# Patient Record
Sex: Male | Born: 1970 | Hispanic: Refuse to answer | Marital: Married | Smoking: Never smoker
Health system: Southern US, Community
[De-identification: ages and names within clinical notes are randomized; demographics above are authoritative.]

## PROBLEM LIST (undated history)

## (undated) DIAGNOSIS — R51 Headache: Secondary | ICD-10-CM

## (undated) DIAGNOSIS — I2510 Atherosclerotic heart disease of native coronary artery without angina pectoris: Secondary | ICD-10-CM

## (undated) DIAGNOSIS — L309 Dermatitis, unspecified: Secondary | ICD-10-CM

## (undated) DIAGNOSIS — R7611 Nonspecific reaction to tuberculin skin test without active tuberculosis: Secondary | ICD-10-CM

## (undated) DIAGNOSIS — K219 Gastro-esophageal reflux disease without esophagitis: Secondary | ICD-10-CM

## (undated) DIAGNOSIS — I10 Essential (primary) hypertension: Secondary | ICD-10-CM

## (undated) DIAGNOSIS — F909 Attention-deficit hyperactivity disorder, unspecified type: Secondary | ICD-10-CM

## (undated) HISTORY — DX: Atherosclerotic heart disease of native coronary artery without angina pectoris: I25.10

## (undated) HISTORY — DX: Essential (primary) hypertension: I10

## (undated) HISTORY — DX: Dermatitis, unspecified: L30.9

## (undated) HISTORY — DX: Nonspecific reaction to tuberculin skin test without active tuberculosis: R76.11

## (undated) HISTORY — PX: OTHER SURGICAL HISTORY: SHX169

## (undated) HISTORY — DX: Headache: R51

## (undated) HISTORY — DX: Gastro-esophageal reflux disease without esophagitis: K21.9

## (undated) HISTORY — DX: Attention-deficit hyperactivity disorder, unspecified type: F90.9

---

## 1999-07-02 HISTORY — PX: UPPER GI ENDOSCOPY: SHX6162

## 2007-10-20 ENCOUNTER — Ambulatory Visit: Payer: Self-pay | Admitting: Family Medicine

## 2007-10-20 DIAGNOSIS — K219 Gastro-esophageal reflux disease without esophagitis: Secondary | ICD-10-CM

## 2007-10-20 DIAGNOSIS — J309 Allergic rhinitis, unspecified: Secondary | ICD-10-CM | POA: Insufficient documentation

## 2007-10-20 DIAGNOSIS — R51 Headache: Secondary | ICD-10-CM

## 2007-10-20 DIAGNOSIS — I10 Essential (primary) hypertension: Secondary | ICD-10-CM | POA: Insufficient documentation

## 2007-10-20 DIAGNOSIS — J45909 Unspecified asthma, uncomplicated: Secondary | ICD-10-CM | POA: Insufficient documentation

## 2007-10-20 DIAGNOSIS — F909 Attention-deficit hyperactivity disorder, unspecified type: Secondary | ICD-10-CM | POA: Insufficient documentation

## 2007-10-20 DIAGNOSIS — R519 Headache, unspecified: Secondary | ICD-10-CM | POA: Insufficient documentation

## 2007-10-20 DIAGNOSIS — J301 Allergic rhinitis due to pollen: Secondary | ICD-10-CM | POA: Insufficient documentation

## 2007-10-22 ENCOUNTER — Ambulatory Visit: Payer: Self-pay | Admitting: Family Medicine

## 2007-10-22 LAB — CONVERTED CEMR LAB
Blood in Urine, dipstick: NEGATIVE
Ketones, urine, test strip: NEGATIVE
Nitrite: NEGATIVE
Protein, U semiquant: NEGATIVE
Urobilinogen, UA: 0.2

## 2007-10-23 LAB — CONVERTED CEMR LAB
Alkaline Phosphatase: 72 units/L (ref 39–117)
Basophils Absolute: 0 10*3/uL (ref 0.0–0.1)
Bilirubin, Direct: 0.1 mg/dL (ref 0.0–0.3)
CO2: 32 meq/L (ref 19–32)
GFR calc Af Amer: 109 mL/min
Glucose, Bld: 86 mg/dL (ref 70–99)
HDL: 34.8 mg/dL — ABNORMAL LOW (ref >39.0)
LDL Cholesterol: 114 mg/dL — ABNORMAL HIGH (ref 0–99)
Lymphocytes Relative: 30.8 % (ref 12.0–46.0)
Monocytes Absolute: 0.5 10*3/uL (ref 0.1–1.0)
Monocytes Relative: 8.7 % (ref 3.0–12.0)
Platelets: 173 10*3/uL (ref 150–400)
Potassium: 4.3 meq/L (ref 3.5–5.1)
RDW: 12.1 % (ref 11.5–14.6)
Sodium: 142 meq/L (ref 135–145)
Total Bilirubin: 1.1 mg/dL (ref 0.3–1.2)
Total CHOL/HDL Ratio: 5.2
Total Protein: 6.8 g/dL (ref 6.0–8.3)
Triglycerides: 165 mg/dL — ABNORMAL HIGH (ref 0–149)
VLDL: 33 mg/dL (ref 0–40)

## 2007-10-27 ENCOUNTER — Encounter: Payer: Self-pay | Admitting: Family Medicine

## 2008-01-05 ENCOUNTER — Ambulatory Visit: Payer: Self-pay | Admitting: Family Medicine

## 2008-01-05 DIAGNOSIS — M545 Low back pain: Secondary | ICD-10-CM

## 2008-04-06 ENCOUNTER — Encounter: Payer: Self-pay | Admitting: Family Medicine

## 2008-12-07 ENCOUNTER — Ambulatory Visit: Payer: Self-pay | Admitting: Family Medicine

## 2008-12-07 LAB — CONVERTED CEMR LAB
Bilirubin Urine: NEGATIVE
Blood in Urine, dipstick: NEGATIVE
Protein, U semiquant: NEGATIVE
Urobilinogen, UA: 0.2
pH: 5

## 2008-12-09 LAB — CONVERTED CEMR LAB
ALT: 51 units/L (ref 0–53)
AST: 32 units/L (ref 0–37)
BUN: 14 mg/dL (ref 6–23)
Basophils Absolute: 0 10*3/uL (ref 0.0–0.1)
Bilirubin, Direct: 0.1 mg/dL (ref 0.0–0.3)
Calcium: 9.2 mg/dL (ref 8.4–10.5)
Cholesterol: 199 mg/dL (ref 0–200)
Creatinine, Ser: 1 mg/dL (ref 0.4–1.5)
Eosinophils Relative: 4.4 % (ref 0.0–5.0)
GFR calc non Af Amer: 89.71 mL/min (ref >60)
Glucose, Bld: 85 mg/dL (ref 70–99)
HDL: 45.1 mg/dL (ref >39.00)
LDL Cholesterol: 123 mg/dL — ABNORMAL HIGH (ref 0–99)
Lymphocytes Relative: 26.2 % (ref 12.0–46.0)
Lymphs Abs: 1.6 10*3/uL (ref 0.7–4.0)
Monocytes Relative: 8.4 % (ref 3.0–12.0)
Neutrophils Relative %: 60.6 % (ref 43.0–77.0)
Platelets: 154 10*3/uL (ref 150.0–400.0)
Potassium: 4.2 meq/L (ref 3.5–5.1)
RDW: 11.9 % (ref 11.5–14.6)
TSH: 1.4 microintl units/mL (ref 0.35–5.50)
Total Bilirubin: 1 mg/dL (ref 0.3–1.2)
Triglycerides: 154 mg/dL — ABNORMAL HIGH (ref 0.0–149.0)
VLDL: 30.8 mg/dL (ref 0.0–40.0)
WBC: 6 10*3/uL (ref 4.5–10.5)

## 2009-02-15 ENCOUNTER — Ambulatory Visit: Payer: Self-pay | Admitting: Family Medicine

## 2009-02-15 DIAGNOSIS — L723 Sebaceous cyst: Secondary | ICD-10-CM

## 2009-02-15 DIAGNOSIS — R002 Palpitations: Secondary | ICD-10-CM

## 2009-02-15 DIAGNOSIS — L989 Disorder of the skin and subcutaneous tissue, unspecified: Secondary | ICD-10-CM | POA: Insufficient documentation

## 2009-03-14 ENCOUNTER — Encounter: Payer: Self-pay | Admitting: Family Medicine

## 2009-03-27 ENCOUNTER — Ambulatory Visit: Payer: Self-pay | Admitting: Family Medicine

## 2009-04-26 ENCOUNTER — Encounter: Payer: Self-pay | Admitting: Family Medicine

## 2009-05-05 ENCOUNTER — Telehealth: Payer: Self-pay | Admitting: Family Medicine

## 2009-06-12 ENCOUNTER — Encounter: Payer: Self-pay | Admitting: Family Medicine

## 2010-02-12 ENCOUNTER — Ambulatory Visit: Payer: Self-pay | Admitting: Family Medicine

## 2010-02-12 LAB — CONVERTED CEMR LAB
Blood in Urine, dipstick: NEGATIVE
Ketones, urine, test strip: NEGATIVE
Nitrite: NEGATIVE
Protein, U semiquant: NEGATIVE
Specific Gravity, Urine: 1.02
WBC Urine, dipstick: NEGATIVE
pH: 7

## 2010-02-13 LAB — CONVERTED CEMR LAB
AST: 23 units/L (ref 0–37)
Alkaline Phosphatase: 78 units/L (ref 39–117)
BUN: 17 mg/dL (ref 6–23)
Basophils Absolute: 0 10*3/uL (ref 0.0–0.1)
Bilirubin, Direct: 0.1 mg/dL (ref 0.0–0.3)
Calcium: 9.4 mg/dL (ref 8.4–10.5)
Eosinophils Relative: 3.7 % (ref 0.0–5.0)
GFR calc non Af Amer: 96.92 mL/min (ref >60)
Glucose, Bld: 89 mg/dL (ref 70–99)
HDL: 44.9 mg/dL (ref >39.00)
Lymphocytes Relative: 31.1 % (ref 12.0–46.0)
Monocytes Relative: 8.4 % (ref 3.0–12.0)
Neutrophils Relative %: 56.4 % (ref 43.0–77.0)
Platelets: 176 10*3/uL (ref 150.0–400.0)
RDW: 13.2 % (ref 11.5–14.6)
TSH: 1.23 microintl units/mL (ref 0.35–5.50)
Total Bilirubin: 0.8 mg/dL (ref 0.3–1.2)
Triglycerides: 118 mg/dL (ref 0.0–149.0)
VLDL: 23.6 mg/dL (ref 0.0–40.0)
WBC: 6 10*3/uL (ref 4.5–10.5)

## 2010-02-19 ENCOUNTER — Ambulatory Visit: Payer: Self-pay | Admitting: Family Medicine

## 2010-03-22 ENCOUNTER — Encounter: Payer: Self-pay | Admitting: Family Medicine

## 2010-05-28 ENCOUNTER — Telehealth: Payer: Self-pay | Admitting: Family Medicine

## 2010-07-03 ENCOUNTER — Telehealth: Payer: Self-pay | Admitting: *Deleted

## 2010-07-10 ENCOUNTER — Ambulatory Visit
Admission: RE | Admit: 2010-07-10 | Discharge: 2010-07-10 | Payer: Self-pay | Source: Home / Self Care | Attending: Family Medicine | Admitting: Family Medicine

## 2010-07-13 ENCOUNTER — Telehealth: Payer: Self-pay | Admitting: Family Medicine

## 2010-07-31 NOTE — Progress Notes (Signed)
Summary: ?about increasing bp med   Phone Note Call from Patient   Caller: vm  Summary of Call: call about increasing his med Benicar 20mg  to 40mg   Initial call taken by: Pura Spice, RN,  May 28, 2010 5:44 PM  Follow-up for Phone Call        called pt left mess to return call   per med list benicar not on his med list.  Follow-up by: Pura Spice, RN,  May 28, 2010 5:44 PM  Additional Follow-up for Phone Call Additional follow up Details #1::        another mess left to return call  Additional Follow-up by: Pura Spice, RN,  May 30, 2010 4:24 PM    Additional Follow-up for Phone Call Additional follow up Details #2::    pt never returned call  Follow-up by: Pura Spice, RN,  June 01, 2010 9:23 AM

## 2010-07-31 NOTE — Assessment & Plan Note (Signed)
Summary: CPX // RS   Vital Signs:  Patient profile:   40 year old male Weight:      217 pounds BMI:     30.37 BP sitting:   110 / 84  (left arm) Cuff size:   regular  Vitals Entered By: Raechel Ache, RN (February 19, 2010 9:10 AM) CC: CPX, labs done.   History of Present Illness: 40 yr old male for a cpx. He feels fine and has no concerns.  Allergies (verified): No Known Drug Allergies  Past History:  Past Medical History: Allergic rhinitis Asthma Headache Hypertension GERD Positive PPD, treated ADHD  Past Surgical History: Reviewed history from 10/20/2007 and no changes required. Denies surgical history  Family History: Reviewed history from 10/20/2007 and no changes required. Family History of CAD Male 1st degree relative <50 Family History Diabetes 1st degree relative Family History High cholesterol Family History Hypertension Family History Lung cancer Family History of Stroke M 1st degree relative <50  Social History: Reviewed history from 10/20/2007 and no changes required.  Alcohol use-yes Marital Status: Married Children:  Occupation: Statistician  Never Smoked  Review of Systems  The patient denies anorexia, fever, weight loss, weight gain, vision loss, decreased hearing, hoarseness, chest pain, syncope, dyspnea on exertion, peripheral edema, prolonged cough, headaches, hemoptysis, abdominal pain, melena, hematochezia, severe indigestion/heartburn, hematuria, incontinence, genital sores, muscle weakness, suspicious skin lesions, transient blindness, difficulty walking, depression, unusual weight change, abnormal bleeding, enlarged lymph nodes, angioedema, breast masses, and testicular masses.    Physical Exam  General:  Well-developed,well-nourished,in no acute distress; alert,appropriate and cooperative throughout examination Head:  Normocephalic and atraumatic without obvious abnormalities. No apparent alopecia or balding. Eyes:   No corneal or conjunctival inflammation noted. EOMI. Perrla. Funduscopic exam benign, without hemorrhages, exudates or papilledema. Vision grossly normal. Ears:  External ear exam shows no significant lesions or deformities.  Otoscopic examination reveals clear canals, tympanic membranes are intact bilaterally without bulging, retraction, inflammation or discharge. Hearing is grossly normal bilaterally. Nose:  External nasal examination shows no deformity or inflammation. Nasal mucosa are pink and moist without lesions or exudates. Mouth:  Oral mucosa and oropharynx without lesions or exudates.  Teeth in good repair. Neck:  No deformities, masses, or tenderness noted. Chest Wall:  No deformities, masses, tenderness or gynecomastia noted. Lungs:  Normal respiratory effort, chest expands symmetrically. Lungs are clear to auscultation, no crackles or wheezes. Heart:  Normal rate and regular rhythm. S1 and S2 normal without gallop, murmur, click, rub or other extra sounds. Abdomen:  Bowel sounds positive,abdomen soft and non-tender without masses, organomegaly or hernias noted. Genitalia:  Testes bilaterally descended without nodularity, tenderness or masses. No scrotal masses or lesions. No penis lesions or urethral discharge. Msk:  No deformity or scoliosis noted of thoracic or lumbar spine.   Pulses:  R and L carotid,radial,femoral,dorsalis pedis and posterior tibial pulses are full and equal bilaterally Extremities:  No clubbing, cyanosis, edema, or deformity noted with normal full range of motion of all joints.   Neurologic:  No cranial nerve deficits noted. Station and gait are normal. Plantar reflexes are down-going bilaterally. DTRs are symmetrical throughout. Sensory, motor and coordinative functions appear intact. Skin:  Intact without suspicious lesions or rashes Cervical Nodes:  No lymphadenopathy noted Axillary Nodes:  No palpable lymphadenopathy Inguinal Nodes:  No significant  adenopathy Psych:  Cognition and judgment appear intact. Alert and cooperative with normal attention span and concentration. No apparent delusions, illusions, hallucinations   Impression & Recommendations:  Problem # 1:  WELL ADULT EXAM (ICD-V70.0)  Complete Medication List: 1)  Veramyst 27.5 Mcg/spray Susp (Fluticasone furoate) .... 2 sprays each nostril once daily 2)  Ventolin Hfa 108 (90 Base) Mcg/act Aers (Albuterol sulfate) .... 2 puffs every 4 hours as needed sob 3)  Prilosec Otc 20 Mg Tbec (Omeprazole magnesium) .... One by mouth daily 4)  Zyrtec Allergy 10 Mg Tabs (Cetirizine hcl) .Marland Kitchen.. 1 once daily prn 5)  Losartan Potassium 50 Mg Tabs (Losartan potassium) .... Once daily  Patient Instructions: 1)  Watch the diet. Switch to Losartan. 2)  Please schedule a follow-up appointment in 1 year.  Prescriptions: LOSARTAN POTASSIUM 50 MG TABS (LOSARTAN POTASSIUM) once daily  #30 x 11   Entered and Authorized by:   Nelwyn Salisbury MD   Signed by:   Nelwyn Salisbury MD on 02/19/2010   Method used:   Electronically to        [facility] [address] (retail)       [address]       [place], [place]  [address]       Ph: [phone]       Fax: [phone]   RxID:   [id number]

## 2010-07-31 NOTE — Letter (Signed)
Summary: Proof of Physical Letter  Proof of Physical Letter   Imported By: Maryln Gottron 03/27/2010 14:01:44  _____________________________________________________________________  External Attachment:    Type:   Image     Comment:   External Document

## 2010-08-02 NOTE — Progress Notes (Signed)
Summary: refill xyzal   Phone Note From Pharmacy   Caller: [facility] # [id number]* Call For: Gershon Crane MD  Summary of Call: refill xyzal 5mg  #30. Never filled now they have generic  would like for 1 yr  call pt [phone] Initial call taken by: Pura Spice, RN,  July 04, 2010 11:18 AM  Follow-up for Phone Call        call in a one year supply Follow-up by: Nelwyn Salisbury MD,  July 05, 2010 1:26 PM  Additional Follow-up for Phone Call Additional follow up Details #1::        Rx sent to pharmacy. Additional Follow-up by: Romualdo Bolk, CMA (AAMA),  July 05, 2010 1:47 PM    New/Updated Medications: XYZAL 5 MG TABS (LEVOCETIRIZINE DIHYDROCHLORIDE) 1 by mouth once daily Prescriptions: XYZAL 5 MG TABS (LEVOCETIRIZINE DIHYDROCHLORIDE) 1 by mouth once daily  #30 x 11   Entered by:   Romualdo Bolk, CMA (AAMA)   Authorized by:   Nelwyn Salisbury MD   Signed by:   Romualdo Bolk, CMA (AAMA) on 07/05/2010   Method used:   Electronically to        [id number] (retail)       [address]       [place], [place]  [address]       Ph: [phone]       Fax: [phone]   RxID:   [id number]

## 2010-08-02 NOTE — Assessment & Plan Note (Signed)
Summary: BP ELEVATED/NJR   Vital Signs:  Patient profile:   40 year old male Weight:      223 pounds O2 Sat:      98 % Temp:     98.3 degrees F Pulse rate:   68 / minute BP sitting:   118 / 80  (left arm) Cuff size:   large  Vitals Entered By: Pura Spice, RN (July 10, 2010 9:53 AM) CC: used to be on benicar  states bp went up and went to [employer] and they gave him a benicar . Pt does not like losartan    History of Present Illness: Here to discuss his BP. he had been taking Benicar with excellent control of his HTN and he felt great. Due to insurance reasons, we tried to switch him to Losartan, but this has not worked out well. He has tried this twice, and each time he felt bad with HAs and fatigue and his BP went up. Then twice he went back to Benicar and did well again.   Allergies (verified): No Known Drug Allergies  Past History:  Past Medical History: Reviewed history from 02/19/2010 and no changes required. Allergic rhinitis Asthma Headache Hypertension GERD Positive PPD, treated ADHD  Review of Systems  The patient denies anorexia, fever, weight loss, weight gain, vision loss, decreased hearing, hoarseness, chest pain, syncope, dyspnea on exertion, peripheral edema, prolonged cough, headaches, hemoptysis, abdominal pain, melena, hematochezia, severe indigestion/heartburn, hematuria, incontinence, genital sores, muscle weakness, suspicious skin lesions, transient blindness, difficulty walking, depression, unusual weight change, abnormal bleeding, enlarged lymph nodes, angioedema, breast masses, and testicular masses.    Physical Exam  General:  Well-developed,well-nourished,in no acute distress; alert,appropriate and cooperative throughout examination Neck:  No deformities, masses, or tenderness noted. Lungs:  Normal respiratory effort, chest expands symmetrically. Lungs are clear to auscultation, no crackles or wheezes. Heart:  Normal rate and regular  rhythm. S1 and S2 normal without gallop, murmur, click, rub or other extra sounds.   Impression & Recommendations:  Problem # 1:  HYPERTENSION (ICD-401.9)  The following medications were removed from the medication list:    Losartan Potassium 50 Mg Tabs (Losartan potassium) ..... Once daily His updated medication list for this problem includes:    Benicar 20 Mg Tabs (Olmesartan medoxomil) ..... Once daily  Complete Medication List: 1)  Veramyst 27.5 Mcg/spray Susp (Fluticasone furoate) .... 2 sprays each nostril once daily 2)  Ventolin Hfa 108 (90 Base) Mcg/act Aers (Albuterol sulfate) .... 2 puffs every 4 hours as needed sob 3)  Prilosec Otc 20 Mg Tbec (Omeprazole magnesium) .... One by mouth daily 4)  Xyzal 5 Mg Tabs (Levocetirizine dihydrochloride) .Marland Kitchen.. 1 by mouth once daily 5)  Benicar 20 Mg Tabs (Olmesartan medoxomil) .... Once daily  Patient Instructions: 1)  We will write for Benicar again, and I gave him samples. Will do a PA for this if needed.  Prescriptions: BENICAR 20 MG TABS (OLMESARTAN MEDOXOMIL) once daily  #30 x 11   Entered and Authorized by:   Nelwyn Salisbury MD   Signed by:   Nelwyn Salisbury MD on 07/10/2010   Method used:   Electronically to        [facility] [address] (retail)       [address]       [place], [place]  [address]       Ph: [phone]       Fax: [phone]   RxID:   [id number]  Orders Added: 1)  Est. Patient Level IV [id number]

## 2010-08-02 NOTE — Progress Notes (Signed)
Summary: rx veramyst   Phone Note Call from Patient   Caller: Patient Summary of Call: requestring [facility]yst [facility]  Initial call taken by: Pura Spice, RN,  July 13, 2010 4:37 PM  Follow-up for Phone Call        done  pt notified  Follow-up by: Pura Spice, RN,  July 13, 2010 4:38 PM    New/Updated Medications: VERAMYST 27.5 MCG/SPRAY  SUSP (FLUTICASONE FUROATE) 2 sprays each nostril once daily Prescriptions: VERAMYST 27.5 MCG/SPRAY  SUSP (FLUTICASONE FUROATE) 2 sprays each nostril once daily  #1 x 6   Entered by:   Pura Spice, RN   Authorized by:   Nelwyn Salisbury MD   Signed by:   Pura Spice, RN on 07/13/2010   Method used:   Electronically to        [id number] (retail)       [address]       [place], [place]  [address]       Ph: [phone]       Fax: [phone]   RxID:   [id number]

## 2011-01-11 ENCOUNTER — Ambulatory Visit: Payer: Self-pay | Admitting: Internal Medicine

## 2011-02-11 ENCOUNTER — Other Ambulatory Visit: Payer: Self-pay

## 2011-02-18 ENCOUNTER — Encounter: Payer: Self-pay | Admitting: Family Medicine

## 2011-03-08 ENCOUNTER — Other Ambulatory Visit (INDEPENDENT_AMBULATORY_CARE_PROVIDER_SITE_OTHER): Payer: BC Managed Care – PPO

## 2011-03-08 DIAGNOSIS — Z0000 Encounter for general adult medical examination without abnormal findings: Secondary | ICD-10-CM

## 2011-03-08 LAB — LIPID PANEL
Cholesterol: 178 mg/dL (ref 0–200)
LDL Cholesterol: 108 mg/dL — ABNORMAL HIGH (ref 0–99)
Triglycerides: 104 mg/dL (ref 0.0–149.0)

## 2011-03-08 LAB — BASIC METABOLIC PANEL
BUN: 16 mg/dL (ref 6–23)
CO2: 32 mEq/L (ref 19–32)
Chloride: 102 mEq/L (ref 96–112)
Glucose, Bld: 89 mg/dL (ref 70–99)
Potassium: 4.3 mEq/L (ref 3.5–5.1)

## 2011-03-08 LAB — HEPATIC FUNCTION PANEL
ALT: 29 U/L (ref 0–53)
Total Bilirubin: 1.2 mg/dL (ref 0.3–1.2)
Total Protein: 6.7 g/dL (ref 6.0–8.3)

## 2011-03-08 LAB — POCT URINALYSIS DIPSTICK
Bilirubin, UA: NEGATIVE
Ketones, UA: NEGATIVE
Leukocytes, UA: NEGATIVE

## 2011-03-08 LAB — CBC WITH DIFFERENTIAL/PLATELET
Eosinophils Absolute: 0.2 10*3/uL (ref 0.0–0.7)
Eosinophils Relative: 4.1 % (ref 0.0–5.0)
HCT: 45.8 % (ref 39.0–52.0)
Lymphs Abs: 1.7 10*3/uL (ref 0.7–4.0)
MCHC: 33.8 g/dL (ref 30.0–36.0)
MCV: 91.6 fl (ref 78.0–100.0)
Monocytes Absolute: 0.4 10*3/uL (ref 0.1–1.0)
Platelets: 180 10*3/uL (ref 150.0–400.0)
WBC: 5.8 10*3/uL (ref 4.5–10.5)

## 2011-03-08 LAB — TSH: TSH: 0.57 u[IU]/mL (ref 0.35–5.50)

## 2011-03-12 NOTE — Progress Notes (Signed)
Quick Note:  Pt aware ______ 

## 2011-03-14 ENCOUNTER — Encounter: Payer: Self-pay | Admitting: Family Medicine

## 2011-03-15 ENCOUNTER — Ambulatory Visit (INDEPENDENT_AMBULATORY_CARE_PROVIDER_SITE_OTHER): Payer: BC Managed Care – PPO | Admitting: Family Medicine

## 2011-03-15 ENCOUNTER — Encounter: Payer: Self-pay | Admitting: Family Medicine

## 2011-03-15 VITALS — BP 126/88 | HR 63 | Temp 98.7°F | Ht 71.5 in | Wt 211.0 lb

## 2011-03-15 DIAGNOSIS — R1011 Right upper quadrant pain: Secondary | ICD-10-CM

## 2011-03-15 DIAGNOSIS — Z0000 Encounter for general adult medical examination without abnormal findings: Secondary | ICD-10-CM

## 2011-03-15 MED ORDER — ESOMEPRAZOLE MAGNESIUM 40 MG PO CPDR: 40.0000 mg | DELAYED_RELEASE_CAPSULE | Freq: Every day | ORAL | Status: DC

## 2011-03-15 MED ORDER — MONTELUKAST SODIUM 10 MG PO TABS: 10.0000 mg | ORAL_TABLET | Freq: Every day | ORAL | Status: DC

## 2011-03-15 MED ORDER — OLMESARTAN MEDOXOMIL 20 MG PO TABS: 20.0000 mg | ORAL_TABLET | Freq: Every day | ORAL | Status: DC

## 2011-03-15 MED ORDER — ALBUTEROL SULFATE HFA 108 (90 BASE) MCG/ACT IN AERS: 2.0000 | INHALATION_SPRAY | Freq: Four times a day (QID) | RESPIRATORY_TRACT | Status: DC | PRN

## 2011-03-15 MED ORDER — FLUTICASONE FUROATE 27.5 MCG/SPRAY NA SUSP: 2.0000 | Freq: Every day | NASAL | Status: DC

## 2011-03-15 NOTE — Progress Notes (Signed)
  Subjective:    Patient ID: Derek Love, male    DOB: 29-Dec-1970, 40 y.o.   MRN: [id number]  HPI 40 yr old male for a cpx. He feels good in general, watches his diet and exercises regularly. He has been using samples of Nexium for his GERD, and this works much better than Omeprazole did. He also complains of sharp RUQ pains that start 10 minutes after eating a meal and which last an hour or two before they go away. Sometimes he gets nauseated with these pains but not usually. No fevers. No change in BMs.    Review of Systems  Constitutional: Negative.   HENT: Negative.   Eyes: Negative.   Respiratory: Negative.   Cardiovascular: Negative.   Gastrointestinal: Positive for nausea and abdominal pain. Negative for vomiting, diarrhea, constipation, blood in stool, abdominal distention and rectal pain.  Genitourinary: Negative.   Musculoskeletal: Negative.   Skin: Negative.   Neurological: Negative.   Hematological: Negative.   Psychiatric/Behavioral: Negative.        Objective:   Physical Exam  Constitutional: He is oriented to person, place, and time. He appears well-developed and well-nourished. No distress.  HENT:  Head: Normocephalic and atraumatic.  Right Ear: External ear normal.  Left Ear: External ear normal.  Nose: Nose normal.  Mouth/Throat: Oropharynx is clear and moist. No oropharyngeal exudate.  Eyes: Conjunctivae and EOM are normal. Pupils are equal, round, and reactive to light. Right eye exhibits no discharge. Left eye exhibits no discharge. No scleral icterus.  Neck: Neck supple. No JVD present. No tracheal deviation present. No thyromegaly present.  Cardiovascular: Normal rate, regular rhythm, normal heart sounds and intact distal pulses.  Exam reveals no gallop and no friction rub.   No murmur heard. Pulmonary/Chest: Effort normal and breath sounds normal. No respiratory distress. He has no wheezes. He has no rales. He exhibits no tenderness.  Abdominal: Soft.  Bowel sounds are normal. He exhibits no distension and no mass. There is no tenderness. There is no rebound and no guarding.  Genitourinary: Rectum normal, prostate normal and penis normal. Guaiac negative stool. No penile tenderness.  Musculoskeletal: Normal range of motion. He exhibits no edema and no tenderness.  Lymphadenopathy:    He has no cervical adenopathy.  Neurological: He is alert and oriented to person, place, and time. He has normal reflexes. No cranial nerve deficit. He exhibits normal muscle tone. Coordination normal.  Skin: Skin is warm and dry. No rash noted. He is not diaphoretic. No erythema. No pallor.  Psychiatric: He has a normal mood and affect. His behavior is normal. Judgment and thought content normal.          Assessment & Plan:  He seems to be doing quite well with his allergies and HTN. Switch to Nexium for the GERD. It sounds like he may have a gall bladder problem so we will set up an abdominal US soon.

## 2011-03-21 ENCOUNTER — Other Ambulatory Visit: Payer: BC Managed Care – PPO

## 2011-03-25 ENCOUNTER — Ambulatory Visit
Admission: RE | Admit: 2011-03-25 | Discharge: 2011-03-25 | Disposition: A | Discharge status: Home / Self Care | Payer: BC Managed Care – PPO | Source: Ambulatory Visit | Attending: Family Medicine | Admitting: Family Medicine

## 2011-03-25 DIAGNOSIS — R1011 Right upper quadrant pain: Secondary | ICD-10-CM

## 2011-03-28 ENCOUNTER — Telehealth: Payer: Self-pay

## 2011-03-28 ENCOUNTER — Other Ambulatory Visit: Payer: BC Managed Care – PPO

## 2011-03-28 NOTE — Telephone Encounter (Signed)
Pt aware of US abdomen results.

## 2011-03-28 NOTE — Progress Notes (Signed)
Quick Note:  Pt aware ______ 

## 2011-08-28 ENCOUNTER — Telehealth: Payer: Self-pay | Admitting: Family Medicine

## 2011-08-28 NOTE — Telephone Encounter (Signed)
Pt called and would like another script for Adderall. He stated that you had given him a script when he was seen last and he never got it filled. He would like to try it now.

## 2011-08-29 MED ORDER — AMPHETAMINE-DEXTROAMPHET ER 10 MG PO CP24: 10.0000 mg | ORAL_CAPSULE | ORAL | Status: DC

## 2011-08-29 NOTE — Telephone Encounter (Signed)
Script is ready for pick up and left voice message. 

## 2011-08-29 NOTE — Telephone Encounter (Signed)
done

## 2011-09-09 ENCOUNTER — Institutional Professional Consult (permissible substitution): Payer: BC Managed Care – PPO | Admitting: Cardiovascular Disease

## 2011-09-16 ENCOUNTER — Encounter: Payer: Self-pay | Admitting: Family Medicine

## 2011-09-16 ENCOUNTER — Ambulatory Visit (INDEPENDENT_AMBULATORY_CARE_PROVIDER_SITE_OTHER): Payer: BC Managed Care – PPO | Admitting: Family Medicine

## 2011-09-16 VITALS — BP 120/76 | HR 66 | Temp 98.5°F | Wt 224.0 lb

## 2011-09-16 DIAGNOSIS — I10 Essential (primary) hypertension: Secondary | ICD-10-CM

## 2011-09-16 MED ORDER — OLMESARTAN MEDOXOMIL 40 MG PO TABS: 40.0000 mg | ORAL_TABLET | Freq: Every day | ORAL | Status: DC

## 2011-09-16 NOTE — Progress Notes (Signed)
  Subjective:    Patient ID: Derek Love, male    DOB: 1971/07/01, 41 y.o.   MRN: [id number]  HPI Here to follow up on elevated BP. His BP last September was fine, but he admits to eating poorly and not exercising over the winter months. He began to feel some mild HAs and lightheadedness last week, and his BP had gone up to the 140s over 90s. He started taking 2 Benicar tablets at a time daily, and his BP has come down nicely. He feels fine now and back to normal.    Review of Systems  Constitutional: Negative.   Respiratory: Negative.   Cardiovascular: Negative.        Objective:   Physical Exam  Constitutional: He appears well-developed and well-nourished.  Cardiovascular: Normal rate, regular rhythm, normal heart sounds and intact distal pulses.   Pulmonary/Chest: Effort normal and breath sounds normal.          Assessment & Plan:  We will increase his Benicar to 40 mg a day and recheck in 6 months. He will adjust his diet and get more exercise.

## 2011-11-05 ENCOUNTER — Telehealth: Payer: Self-pay | Admitting: Family Medicine

## 2011-11-05 NOTE — Telephone Encounter (Signed)
Pt left a voice message and is requesting a inhaler, besides the albuterol.

## 2011-11-05 NOTE — Telephone Encounter (Signed)
Call in Flovent 110 mcg to take 2 puffs bid, for one year

## 2011-11-06 MED ORDER — ALBUTEROL SULFATE HFA 108 (90 BASE) MCG/ACT IN AERS: 2.0000 | INHALATION_SPRAY | Freq: Two times a day (BID) | RESPIRATORY_TRACT | Status: DC

## 2011-11-06 MED ORDER — ALBUTEROL SULFATE HFA 108 (90 BASE) MCG/ACT IN AERS: 2.0000 | INHALATION_SPRAY | Freq: Four times a day (QID) | RESPIRATORY_TRACT | Status: DC | PRN

## 2011-11-06 MED ORDER — FLUTICASONE PROPIONATE HFA 110 MCG/ACT IN AERO: 2.0000 | INHALATION_SPRAY | Freq: Two times a day (BID) | RESPIRATORY_TRACT | Status: DC

## 2011-11-06 MED ORDER — FLUTICASONE PROPIONATE HFA 110 MCG/ACT IN AERO: 1.0000 | INHALATION_SPRAY | Freq: Two times a day (BID) | RESPIRATORY_TRACT | Status: DC

## 2011-11-06 NOTE — Telephone Encounter (Signed)
I sent script e-scribe and left voice message for pt 

## 2012-02-18 ENCOUNTER — Other Ambulatory Visit (INDEPENDENT_AMBULATORY_CARE_PROVIDER_SITE_OTHER): Payer: BC Managed Care – PPO

## 2012-02-18 DIAGNOSIS — Z0000 Encounter for general adult medical examination without abnormal findings: Secondary | ICD-10-CM

## 2012-02-18 DIAGNOSIS — Z13220 Encounter for screening for lipoid disorders: Secondary | ICD-10-CM

## 2012-02-18 LAB — BASIC METABOLIC PANEL
BUN: 12 mg/dL (ref 6–23)
CO2: 30 mEq/L (ref 19–32)
Chloride: 102 mEq/L (ref 96–112)
Potassium: 4 mEq/L (ref 3.5–5.1)

## 2012-02-18 LAB — HEPATIC FUNCTION PANEL
Albumin: 4.2 g/dL (ref 3.5–5.2)
Alkaline Phosphatase: 67 U/L (ref 39–117)
Bilirubin, Direct: 0.1 mg/dL (ref 0.0–0.3)
Total Protein: 6.6 g/dL (ref 6.0–8.3)

## 2012-02-18 LAB — POCT URINALYSIS DIPSTICK
Bilirubin, UA: NEGATIVE
Blood, UA: NEGATIVE
Ketones, UA: NEGATIVE
Protein, UA: NEGATIVE
pH, UA: 6.5

## 2012-02-18 LAB — CBC WITH DIFFERENTIAL/PLATELET
Basophils Absolute: 0 10*3/uL (ref 0.0–0.1)
Basophils Relative: 0.6 % (ref 0.0–3.0)
Eosinophils Absolute: 0.2 10*3/uL (ref 0.0–0.7)
Lymphocytes Relative: 33.3 % (ref 12.0–46.0)
MCHC: 34.1 g/dL (ref 30.0–36.0)
MCV: 90.4 fl (ref 78.0–100.0)
Monocytes Absolute: 0.5 10*3/uL (ref 0.1–1.0)
Neutrophils Relative %: 54.1 % (ref 43.0–77.0)
Platelets: 172 10*3/uL (ref 150.0–400.0)
RDW: 12.5 % (ref 11.5–14.6)

## 2012-02-18 LAB — LIPID PANEL
Cholesterol: 191 mg/dL (ref 0–200)
HDL: 43.6 mg/dL (ref >39.00)
Total CHOL/HDL Ratio: 4
Triglycerides: 269 mg/dL — ABNORMAL HIGH (ref 0.0–149.0)

## 2012-02-20 NOTE — Progress Notes (Signed)
Quick Note:  I spoke with pt ______ 

## 2012-03-16 ENCOUNTER — Ambulatory Visit (INDEPENDENT_AMBULATORY_CARE_PROVIDER_SITE_OTHER): Payer: BC Managed Care – PPO | Admitting: Family Medicine

## 2012-03-16 ENCOUNTER — Encounter: Payer: Self-pay | Admitting: Family Medicine

## 2012-03-16 VITALS — BP 120/70 | HR 73 | Temp 98.4°F | Ht 71.25 in | Wt 219.0 lb

## 2012-03-16 DIAGNOSIS — Z0000 Encounter for general adult medical examination without abnormal findings: Secondary | ICD-10-CM

## 2012-03-16 MED ORDER — AMPHETAMINE-DEXTROAMPHET ER 10 MG PO CP24: 10.0000 mg | ORAL_CAPSULE | ORAL | Status: DC

## 2012-03-16 NOTE — Progress Notes (Signed)
  Subjective:    Patient ID: Derek Love, male    DOB: Nov 03, 1970, 41 y.o.   MRN: [id number]  HPI 41 yr old male for a cpx. He feels well and has no concerns.    Review of Systems  Constitutional: Negative.   HENT: Negative.   Eyes: Negative.   Respiratory: Negative.   Cardiovascular: Negative.   Gastrointestinal: Negative.   Genitourinary: Negative.   Musculoskeletal: Negative.   Skin: Negative.   Neurological: Negative.   Hematological: Negative.   Psychiatric/Behavioral: Negative.        Objective:   Physical Exam  Constitutional: He is oriented to person, place, and time. He appears well-developed and well-nourished. No distress.  HENT:  Head: Normocephalic and atraumatic.  Right Ear: External ear normal.  Left Ear: External ear normal.  Nose: Nose normal.  Mouth/Throat: Oropharynx is clear and moist. No oropharyngeal exudate.  Eyes: Conjunctivae normal and EOM are normal. Pupils are equal, round, and reactive to light. Right eye exhibits no discharge. Left eye exhibits no discharge. No scleral icterus.  Neck: Neck supple. No JVD present. No tracheal deviation present. No thyromegaly present.  Cardiovascular: Normal rate, regular rhythm, normal heart sounds and intact distal pulses.  Exam reveals no gallop and no friction rub.   No murmur heard. Pulmonary/Chest: Effort normal and breath sounds normal. No respiratory distress. He has no wheezes. He has no rales. He exhibits no tenderness.  Abdominal: Soft. Bowel sounds are normal. He exhibits no distension and no mass. There is no tenderness. There is no rebound and no guarding.  Genitourinary: Rectum normal, prostate normal and penis normal. Guaiac negative stool. No penile tenderness.  Musculoskeletal: Normal range of motion. He exhibits no edema and no tenderness.  Lymphadenopathy:    He has no cervical adenopathy.  Neurological: He is alert and oriented to person, place, and time. He has normal reflexes. No  cranial nerve deficit. He exhibits normal muscle tone. Coordination normal.  Skin: Skin is warm and dry. No rash noted. He is not diaphoretic. No erythema. No pallor.  Psychiatric: He has a normal mood and affect. His behavior is normal. Judgment and thought content normal.          Assessment & Plan:  Well exam.

## 2012-09-28 ENCOUNTER — Other Ambulatory Visit: Payer: Self-pay | Admitting: Family Medicine

## 2012-11-25 ENCOUNTER — Other Ambulatory Visit: Payer: Self-pay | Admitting: Family Medicine

## 2012-12-03 ENCOUNTER — Telehealth: Payer: Self-pay | Admitting: Family Medicine

## 2012-12-03 MED ORDER — AMPHETAMINE-DEXTROAMPHET ER 10 MG PO CP24: 10.0000 mg | ORAL_CAPSULE | ORAL | Status: DC

## 2012-12-03 NOTE — Telephone Encounter (Signed)
Pt needs refill of amphetamine-dextroamphetamine (ADDERALL XR) 10 MG 24 hr capsule Pt takes only as needed. Has not taken in 6 mos.

## 2012-12-03 NOTE — Telephone Encounter (Signed)
done

## 2012-12-03 NOTE — Telephone Encounter (Signed)
Script is ready for pick up, tried to reach pt by phone and no answer. 

## 2013-06-15 ENCOUNTER — Other Ambulatory Visit: Payer: BC Managed Care – PPO

## 2013-06-22 ENCOUNTER — Encounter: Payer: BC Managed Care – PPO | Admitting: Family Medicine

## 2013-07-12 ENCOUNTER — Other Ambulatory Visit (INDEPENDENT_AMBULATORY_CARE_PROVIDER_SITE_OTHER): Payer: BC Managed Care – PPO

## 2013-07-12 DIAGNOSIS — Z0000 Encounter for general adult medical examination without abnormal findings: Secondary | ICD-10-CM

## 2013-07-12 LAB — POCT URINALYSIS DIPSTICK
Bilirubin, UA: NEGATIVE
Blood, UA: NEGATIVE
Glucose, UA: NEGATIVE
Ketones, UA: NEGATIVE
Leukocytes, UA: NEGATIVE
Nitrite, UA: NEGATIVE
Protein, UA: NEGATIVE
Spec Grav, UA: 1.02
Urobilinogen, UA: 0.2
pH, UA: 6

## 2013-07-12 LAB — CBC WITH DIFFERENTIAL/PLATELET
BASOS ABS: 0 10*3/uL (ref 0.0–0.1)
BASOS PCT: 0.4 % (ref 0.0–3.0)
EOS ABS: 0.2 10*3/uL (ref 0.0–0.7)
Eosinophils Relative: 3.8 % (ref 0.0–5.0)
HCT: 44.6 % (ref 39.0–52.0)
HEMOGLOBIN: 15.4 g/dL (ref 13.0–17.0)
LYMPHS PCT: 31.5 % (ref 12.0–46.0)
Lymphs Abs: 1.8 10*3/uL (ref 0.7–4.0)
MCHC: 34.5 g/dL (ref 30.0–36.0)
MCV: 88.4 fl (ref 78.0–100.0)
MONO ABS: 0.6 10*3/uL (ref 0.1–1.0)
Monocytes Relative: 9.8 % (ref 3.0–12.0)
NEUTROS ABS: 3.1 10*3/uL (ref 1.4–7.7)
Neutrophils Relative %: 54.5 % (ref 43.0–77.0)
Platelets: 186 10*3/uL (ref 150.0–400.0)
RBC: 5.05 Mil/uL (ref 4.22–5.81)
RDW: 13 % (ref 11.5–14.6)
WBC: 5.6 10*3/uL (ref 4.5–10.5)

## 2013-07-12 LAB — BASIC METABOLIC PANEL
BUN: 14 mg/dL (ref 6–23)
CALCIUM: 9.3 mg/dL (ref 8.4–10.5)
CHLORIDE: 104 meq/L (ref 96–112)
CO2: 31 meq/L (ref 19–32)
CREATININE: 1 mg/dL (ref 0.4–1.5)
GFR: 92.91 mL/min (ref >60.00)
GLUCOSE: 89 mg/dL (ref 70–99)
Potassium: 4.1 mEq/L (ref 3.5–5.1)
SODIUM: 141 meq/L (ref 135–145)

## 2013-07-12 LAB — HEPATIC FUNCTION PANEL
ALBUMIN: 4.4 g/dL (ref 3.5–5.2)
ALK PHOS: 62 U/L (ref 39–117)
ALT: 30 U/L (ref 0–53)
AST: 23 U/L (ref 0–37)
Bilirubin, Direct: 0.1 mg/dL (ref 0.0–0.3)
TOTAL PROTEIN: 6.8 g/dL (ref 6.0–8.3)
Total Bilirubin: 0.8 mg/dL (ref 0.3–1.2)

## 2013-07-12 LAB — LDL CHOLESTEROL, DIRECT: Direct LDL: 142 mg/dL

## 2013-07-12 LAB — LIPID PANEL
Cholesterol: 213 mg/dL — ABNORMAL HIGH (ref 0–200)
HDL: 51.2 mg/dL (ref >39.00)
Total CHOL/HDL Ratio: 4
Triglycerides: 86 mg/dL (ref 0.0–149.0)
VLDL: 17.2 mg/dL (ref 0.0–40.0)

## 2013-07-12 LAB — TSH: TSH: 1.3 u[IU]/mL (ref 0.35–5.50)

## 2013-07-19 ENCOUNTER — Ambulatory Visit (INDEPENDENT_AMBULATORY_CARE_PROVIDER_SITE_OTHER): Payer: BC Managed Care – PPO | Admitting: Family Medicine

## 2013-07-19 ENCOUNTER — Encounter: Payer: Self-pay | Admitting: Family Medicine

## 2013-07-19 VITALS — BP 124/74 | HR 84 | Temp 98.6°F | Ht 71.25 in | Wt 214.0 lb

## 2013-07-19 DIAGNOSIS — Z0000 Encounter for general adult medical examination without abnormal findings: Secondary | ICD-10-CM

## 2013-07-19 MED ORDER — LOSARTAN POTASSIUM 100 MG PO TABS: 100.0000 mg | ORAL_TABLET | Freq: Every day | ORAL | Status: DC

## 2013-07-19 MED ORDER — AMPHETAMINE-DEXTROAMPHET ER 10 MG PO CP24: 10.0000 mg | ORAL_CAPSULE | Freq: Every day | ORAL | Status: DC | PRN

## 2013-07-19 NOTE — Progress Notes (Signed)
Pre visit review using our clinic review tool, if applicable. No additional management support is needed unless otherwise documented below in the visit note. 

## 2013-07-19 NOTE — Progress Notes (Signed)
   Subjective:    Patient ID: Derek Love, male    DOB: 19-Jun-1971, 43 y.o.   MRN: [id number]  HPI 43 yr old male for a cpx. He feels fine but he mentions an occasional mild thump in the chest. No chest pain or SOB. He typically drinks 4 or 5 espresso drinks every day.    Review of Systems  Constitutional: Negative.   HENT: Negative.   Eyes: Negative.   Respiratory: Negative.   Cardiovascular: Positive for palpitations. Negative for chest pain and leg swelling.  Gastrointestinal: Negative.   Genitourinary: Negative.   Musculoskeletal: Negative.   Skin: Negative.   Neurological: Negative.   Psychiatric/Behavioral: Negative.        Objective:   Physical Exam  Constitutional: He is oriented to person, place, and time. He appears well-developed and well-nourished. No distress.  HENT:  Head: Normocephalic and atraumatic.  Right Ear: External ear normal.  Left Ear: External ear normal.  Nose: Nose normal.  Mouth/Throat: Oropharynx is clear and moist. No oropharyngeal exudate.  Eyes: Conjunctivae and EOM are normal. Pupils are equal, round, and reactive to light. Right eye exhibits no discharge. Left eye exhibits no discharge. No scleral icterus.  Neck: Neck supple. No JVD present. No tracheal deviation present. No thyromegaly present.  Cardiovascular: Normal rate, regular rhythm, normal heart sounds and intact distal pulses.  Exam reveals no gallop and no friction rub.   No murmur heard. Pulmonary/Chest: Effort normal and breath sounds normal. No respiratory distress. He has no wheezes. He has no rales. He exhibits no tenderness.  Abdominal: Soft. Bowel sounds are normal. He exhibits no distension and no mass. There is no tenderness. There is no rebound and no guarding.  Genitourinary: Rectum normal, prostate normal and penis normal. Guaiac negative stool. No penile tenderness.  Musculoskeletal: Normal range of motion. He exhibits no edema and no tenderness.  Lymphadenopathy:    He has no cervical adenopathy.  Neurological: He is alert and oriented to person, place, and time. He has normal reflexes. No cranial nerve deficit. He exhibits normal muscle tone. Coordination normal.  Skin: Skin is warm and dry. No rash noted. He is not diaphoretic. No erythema. No pallor.  Psychiatric: He has a normal mood and affect. His behavior is normal. Judgment and thought content normal.          Assessment & Plan:  Well exam. It sounds like he has some occasional ectopy which is probably benign. Advised him to decrease his caffeine use.

## 2013-09-13 ENCOUNTER — Telehealth: Payer: Self-pay | Admitting: Family Medicine

## 2013-09-13 NOTE — Telephone Encounter (Signed)
Pt changed from Benica to Cozaar due to cost. He has been feeling dizzy & having some headaches. He wants to try something else maybe Diovan/Valsartan?

## 2013-09-13 NOTE — Telephone Encounter (Signed)
Change to Diovan 160 mg daily. Call in one year supply.

## 2013-09-14 MED ORDER — VALSARTAN 160 MG PO TABS: 160.0000 mg | ORAL_TABLET | Freq: Every day | ORAL | Status: DC

## 2013-09-14 NOTE — Telephone Encounter (Signed)
I spoke with pt and sent script e-scribe. 

## 2013-10-02 ENCOUNTER — Other Ambulatory Visit: Payer: Self-pay | Admitting: Family Medicine

## 2014-08-26 ENCOUNTER — Telehealth: Payer: Self-pay | Admitting: Family Medicine

## 2014-08-26 ENCOUNTER — Ambulatory Visit (INDEPENDENT_AMBULATORY_CARE_PROVIDER_SITE_OTHER): Payer: 59 | Admitting: Family Medicine

## 2014-08-26 ENCOUNTER — Encounter: Payer: Self-pay | Admitting: Family Medicine

## 2014-08-26 VITALS — BP 126/78 | HR 73 | Temp 98.8°F | Ht 71.25 in | Wt 217.0 lb

## 2014-08-26 DIAGNOSIS — J0100 Acute maxillary sinusitis, unspecified: Secondary | ICD-10-CM

## 2014-08-26 MED ORDER — TOBRAMYCIN-DEXAMETHASONE 0.3-0.1 % OP SUSP: 2.0000 [drp] | Freq: Two times a day (BID) | OPHTHALMIC | Status: DC

## 2014-08-26 MED ORDER — SULFACETAMIDE-PREDNISOLONE 10-0.2 % OP SUSP: 2.0000 [drp] | OPHTHALMIC | Status: DC

## 2014-08-26 MED ORDER — AZITHROMYCIN 250 MG PO TABS: ORAL_TABLET | ORAL | Status: DC

## 2014-08-26 NOTE — Progress Notes (Signed)
   Subjective:    Patient ID: Salley SlaughterCarlos Loeber, male    DOB: 06/18/1971, 44 y.o.   MRN: [id number]  HPI Here for one week of sinus pressure, HA, PND and a dry cough.    Review of Systems  Constitutional: Negative.   HENT: Positive for congestion, postnasal drip and sinus pressure.   Eyes: Negative.   Respiratory: Positive for cough.        Objective:   Physical Exam  Constitutional: He appears well-developed and well-nourished.  HENT:  Right Ear: External ear normal.  Left Ear: External ear normal.  Nose: Nose normal.  Mouth/Throat: Oropharynx is clear and moist.  Eyes:  Both conjunctivae are red with yellow DC  Pulmonary/Chest: Effort normal and breath sounds normal.  Lymphadenopathy:    He has no cervical adenopathy.          Assessment & Plan:  Add Mucinex

## 2014-08-26 NOTE — Telephone Encounter (Signed)
Pt is at pharmacy to get eye drops filled and they are out of the Blephamide. Per Dr. Clent RidgesFry order Tobradex apply 2 drops twice a day. I sent script e-scribe and spoke with pt.

## 2014-08-26 NOTE — Progress Notes (Signed)
Pre visit review using our clinic review tool, if applicable. No additional management support is needed unless otherwise documented below in the visit note. 

## 2014-08-26 NOTE — Addendum Note (Signed)
Addended by: Aniceto BossNIMMONS, SYLVIA A on: 08/26/2014 04:02 PM   Modules accepted: Orders

## 2014-09-05 ENCOUNTER — Other Ambulatory Visit: Payer: 59

## 2014-09-12 ENCOUNTER — Encounter: Payer: Self-pay | Admitting: Family Medicine

## 2014-10-12 ENCOUNTER — Other Ambulatory Visit: Payer: Self-pay | Admitting: Family Medicine

## 2014-10-31 ENCOUNTER — Other Ambulatory Visit: Payer: Self-pay

## 2014-11-07 ENCOUNTER — Encounter: Payer: Self-pay | Admitting: Family Medicine

## 2014-12-26 ENCOUNTER — Other Ambulatory Visit: Payer: Self-pay

## 2015-01-09 ENCOUNTER — Encounter: Payer: Self-pay | Admitting: Family Medicine

## 2015-02-27 ENCOUNTER — Other Ambulatory Visit: Payer: 59

## 2015-03-13 ENCOUNTER — Encounter: Payer: Self-pay | Admitting: Family Medicine

## 2015-04-07 ENCOUNTER — Other Ambulatory Visit: Payer: 59

## 2015-04-11 ENCOUNTER — Encounter: Payer: Self-pay | Admitting: Family Medicine

## 2015-05-09 ENCOUNTER — Other Ambulatory Visit: Payer: 59

## 2015-05-15 ENCOUNTER — Encounter: Payer: Self-pay | Admitting: Family Medicine

## 2015-07-05 ENCOUNTER — Ambulatory Visit (INDEPENDENT_AMBULATORY_CARE_PROVIDER_SITE_OTHER): Payer: 59 | Admitting: Family Medicine

## 2015-07-05 ENCOUNTER — Encounter: Payer: Self-pay | Admitting: Family Medicine

## 2015-07-05 VITALS — BP 130/80 | Temp 98.5°F | Ht 71.25 in | Wt 222.5 lb

## 2015-07-05 DIAGNOSIS — Z0000 Encounter for general adult medical examination without abnormal findings: Secondary | ICD-10-CM | POA: Diagnosis not present

## 2015-07-05 MED ORDER — FUROSEMIDE 20 MG PO TABS
20.0000 mg | ORAL_TABLET | Freq: Every day | ORAL | Status: DC | PRN
Start: 2015-07-05 — End: 2015-10-09

## 2015-07-05 MED ORDER — MONTELUKAST SODIUM 10 MG PO TABS: 10.0000 mg | ORAL_TABLET | Freq: Every day | ORAL | Status: DC

## 2015-07-05 MED ORDER — AMPHETAMINE-DEXTROAMPHET ER 10 MG PO CP24: 10.0000 mg | ORAL_CAPSULE | Freq: Every day | ORAL | Status: DC | PRN

## 2015-07-05 MED ORDER — FLUTICASONE PROPIONATE HFA 110 MCG/ACT IN AERO: INHALATION_SPRAY | RESPIRATORY_TRACT | Status: DC

## 2015-07-05 NOTE — Progress Notes (Signed)
Pre visit review using our clinic review tool, if applicable. No additional management support is needed unless otherwise documented below in the visit note. 

## 2015-07-05 NOTE — Progress Notes (Signed)
   Subjective:    Patient ID: Derek Love, male    DOB: 1971/06/20, 45 y.o.   MRN: [id number]  HPI 45 yr old male for a cpx. He feels well and his BP has been stable. He asks for refills on his allergy meds. These usually flare up in April or May each year. Also he asks for a medication he can use for swelling on an as needed basis. In a few weeks he and his [place] will be traveling [place] and will be [place]. When he has been to such places in the past he would often develop some swelling in the hands, feet, and face. No chest pains or SOB.    Review of Systems  Constitutional: Negative.   HENT: Negative.   Eyes: Negative.   Respiratory: Negative.   Cardiovascular: Negative.   Gastrointestinal: Negative.   Genitourinary: Negative.   Musculoskeletal: Negative.   Skin: Negative.   Neurological: Negative.   Psychiatric/Behavioral: Negative.        Objective:   Physical Exam  Constitutional: He is oriented to person, place, and time. He appears well-developed and well-nourished. No distress.  HENT:  Head: Normocephalic and atraumatic.  Right Ear: External ear normal.  Left Ear: External ear normal.  Nose: Nose normal.  Mouth/Throat: Oropharynx is clear and moist. No oropharyngeal exudate.  Eyes: Conjunctivae and EOM are normal. Pupils are equal, round, and reactive to light. Right eye exhibits no discharge. Left eye exhibits no discharge. No scleral icterus.  Neck: Neck supple. No JVD present. No tracheal deviation present. No thyromegaly present.  Cardiovascular: Normal rate, regular rhythm, normal heart sounds and intact distal pulses.  Exam reveals no gallop and no friction rub.   No murmur heard. Pulmonary/Chest: Effort normal and breath sounds normal. No respiratory distress. He has no wheezes. He has no rales. He exhibits no tenderness.  Abdominal: Soft. Bowel sounds are normal. He exhibits no distension and  no mass. There is no tenderness. There is no rebound and no guarding.  Genitourinary: Penis normal. No penile tenderness.  Musculoskeletal: Normal range of motion. He exhibits no edema or tenderness.  Lymphadenopathy:    He has no cervical adenopathy.  Neurological: He is alert and oriented to person, place, and time. He has normal reflexes. No cranial nerve deficit. He exhibits normal muscle tone. Coordination normal.  Skin: Skin is warm and dry. No rash noted. He is not diaphoretic. No erythema. No pallor.  Psychiatric: He has a normal mood and affect. His behavior is normal. Judgment and thought content normal.          Assessment & Plan:  Well exam. We discussed diet and exercise. He will get fasting labs one day this week. Try Lasix as needed for high altitude travel.

## 2015-07-11 ENCOUNTER — Other Ambulatory Visit: Payer: 59

## 2015-10-05 ENCOUNTER — Other Ambulatory Visit: Payer: Self-pay | Admitting: Family Medicine

## 2015-10-09 ENCOUNTER — Encounter: Payer: Self-pay | Admitting: Family Medicine

## 2015-10-09 ENCOUNTER — Ambulatory Visit (INDEPENDENT_AMBULATORY_CARE_PROVIDER_SITE_OTHER): Payer: 59 | Admitting: Family Medicine

## 2015-10-09 VITALS — BP 120/70 | HR 78 | Temp 98.4°F | Ht 71.25 in | Wt 217.1 lb

## 2015-10-09 DIAGNOSIS — K130 Diseases of lips: Secondary | ICD-10-CM

## 2015-10-09 DIAGNOSIS — R002 Palpitations: Secondary | ICD-10-CM | POA: Diagnosis not present

## 2015-10-09 NOTE — Patient Instructions (Signed)
Before you leave: -Schedule follow-up with your doctor in about 3-4 weeks  Please combine a small amount of clotrimazole cream with hydrocortisone cream and apply twice daily to the lower lip. Please use Aquaphor and reapply every 2-3 hours throughout the day and before bed to protect the left. Please do not use the dye for the beard for several weeks to see if this helps.  Please slowly cut down on caffeine and try to eliminate. Follow up with your doctor as planned to see if this helps with your palpitations.

## 2015-10-09 NOTE — Progress Notes (Signed)
HPI:  Derek SlaughterCarlos Love is a pleasant 45 year old here for several issues. First of all, he has had a dry and chapped lower lip for several months. He has tried sporadic use of Aquaphor, over-the-counter antifungal cream once and hydrocortisone cream a few times. However he continues to have dry chapped lip that sometimes swells due to chapping. He has been using a new dye on his beard and wonders if this could be contributing. Another issue that he reports, is chronic palpitations. He reports he has had these since he was a child. They can occur at any time and sometimes occur several times a day. He does drink several cups of coffee per day including espresso. He does have a lot of stress, but reports this is normal for him. He denies dyspnea on exertion, chest discomfort with exercise, shortness of breath, dyspnea or syncope.  ROS: See pertinent positives and negatives per HPI.  Past Medical History  Diagnosis Date  . Allergic rhinitis   . Asthma   . Headache(784.0)   . Hypertension   . GERD (gastroesophageal reflux disease)   . Positive PPD, treated   . ADHD (attention deficit hyperactivity disorder)     Past Surgical History  Procedure Laterality Date  . No surgical history      Family History  Problem Relation Age of Onset  . Coronary artery disease    . Diabetes    . Hyperlipidemia    . Hypertension    . Lung cancer    . Stroke      Social History   Social History  . Marital Status: Married    Spouse Name: N/A  . Number of Children: N/A  . Years of Education: N/A   Social History Main Topics  . Smoking status: Never Smoker   . Smokeless tobacco: Never Used  . Alcohol Use: 0.0 oz/week    0 Standard drinks or equivalent per week     Comment: occ  . Drug Use: No  . Sexual Activity: Not Asked   Other Topics Concern  . None   Social History Narrative   Married   Occupation: Spanish- Recruitment consultantnglish translator           Current outpatient prescriptions:  .   cetirizine (ZYRTEC) 10 MG tablet, Take 10 mg by mouth daily., Disp: , Rfl:  .  fluticasone (FLONASE) 50 MCG/ACT nasal spray, Place 1 spray into both nostrils daily., Disp: , Rfl:  .  montelukast (SINGULAIR) 10 MG tablet, Take 1 tablet (10 mg total) by mouth at bedtime., Disp: 90 tablet, Rfl: 3 .  omeprazole (PRILOSEC OTC) 20 MG tablet, Take 20 mg by mouth as needed., Disp: , Rfl:  .  valsartan (DIOVAN) 160 MG tablet, TAKE ONE TABLET BY MOUTH ONE TIME DAILY, Disp: 30 tablet, Rfl: 11 .  amphetamine-dextroamphetamine (ADDERALL XR) 10 MG 24 hr capsule, Take 1 capsule (10 mg total) by mouth daily as needed., Disp: 30 capsule, Rfl: 0  EXAM:  Filed Vitals:   10/09/15 0947  BP: 120/70  Pulse: 78  Temp: 98.4 F (36.9 C)    Body mass index is 30.06 kg/(m^2).  GENERAL: vitals reviewed and listed above, alert, oriented, appears well hydrated and in no acute distress  HEENT: atraumatic, conjunttiva clear, no obvious abnormalities on inspection of external nose and ears, lower lip irritated and cracked with some scaling.  NECK: no obvious masses on inspection  LUNGS: clear to auscultation bilaterally, no wheezes, rales or rhonchi, good air movement  CV: HRRR,  no peripheral edema  MS: moves all extremities without noticeable abnormality  PSYCH: pleasant and cooperative, no obvious depression or anxiety  ASSESSMENT AND PLAN:  Discussed the following assessment and plan:  Palpitations - Plan: EKG 12-Lead --we discussed possible serious and likely etiologies, workup and treatment, treatment risks and return precautions - this is not new for significantly worsened recently. His EKG is unchanged from prior with minimal sinus bradycardia- however HR around this in the past. -after this discussion, Derek Love opted for elimination of caffeine and follow up with PCP. Consider Holter monitor if palpitations continue at significant frequency. -of course, we advised Derek Love  to return or notify a doctor  immediately if symptoms worsen or persist or new concerns arise.  Chapped lips -Trial antifungal wit with low-dose steroid along with protectant emmollient. Follow up in 3-4 weeks.   -Patient advised to return or notify a doctor immediately if symptoms worsen or persist or new concerns arise.  Patient Instructions  Before you leave: -Schedule follow-up with your doctor in about 3-4 weeks  Please combine a small amount of clotrimazole cream with hydrocortisone cream and apply twice daily to the lower lip. Please use Aquaphor and reapply every 2-3 hours throughout the day and before bed to protect the left. Please do not use the dye for the beard for several weeks to see if this helps.  Please slowly cut down on caffeine and try to eliminate. Follow up with your doctor as planned to see if this helps with your palpitations.     Kriste Basque R.

## 2015-10-09 NOTE — Progress Notes (Signed)
Pre visit review using our clinic review tool, if applicable. No additional management support is needed unless otherwise documented below in the visit note. 

## 2015-11-03 ENCOUNTER — Ambulatory Visit: Payer: 59 | Admitting: Family Medicine

## 2016-03-22 ENCOUNTER — Ambulatory Visit: Payer: 59 | Admitting: Family Medicine

## 2016-06-27 ENCOUNTER — Encounter: Payer: Self-pay | Admitting: Family Medicine

## 2016-06-27 ENCOUNTER — Ambulatory Visit (INDEPENDENT_AMBULATORY_CARE_PROVIDER_SITE_OTHER): Payer: 59 | Admitting: Family Medicine

## 2016-06-27 VITALS — BP 120/82 | HR 72 | Temp 97.8°F | Wt 220.8 lb

## 2016-06-27 DIAGNOSIS — L03011 Cellulitis of right finger: Secondary | ICD-10-CM

## 2016-06-27 DIAGNOSIS — L209 Atopic dermatitis, unspecified: Secondary | ICD-10-CM

## 2016-06-27 MED ORDER — CEPHALEXIN 500 MG PO CAPS
500.0000 mg | ORAL_CAPSULE | Freq: Four times a day (QID) | ORAL | 0 refills | Status: DC
Start: 2016-06-27 — End: 2016-07-31

## 2016-06-27 NOTE — Patient Instructions (Signed)
Please take medication as directed and follow up if symptoms do not improve in 2 to 3 days, worsen, or you develop a fever.  Cellulitis, Adult Introduction Cellulitis is a skin infection. The infected area is usually red and sore. This condition occurs most often in the arms and lower legs. It is very important to get treated for this condition. Follow these instructions at home:  Take over-the-counter and prescription medicines only as told by your doctor.  If you were prescribed an antibiotic medicine, take it as told by your doctor. Do not stop taking the antibiotic even if you start to feel better.  Drink enough fluid to keep your pee (urine) clear or pale yellow.  Do not touch or rub the infected area.  Raise (elevate) the infected area above the level of your heart while you are sitting or lying down.  Place warm or cold wet cloths (warm or cold compresses) on the infected area. Do this as told by your doctor.  Keep all follow-up visits as told by your doctor. This is important. These visits let your doctor make sure your infection is not getting worse. Contact a doctor if:  You have a fever.  Your symptoms do not get better after 1-2 days of treatment.  Your bone or joint under the infected area starts to hurt after the skin has healed.  Your infection comes back. This can happen in the same area or another area.  You have a swollen bump in the infected area.  You have new symptoms.  You feel ill and also have muscle aches and pains. Get help right away if:  Your symptoms get worse.  You feel very sleepy.  You throw up (vomit) or have watery poop (diarrhea) for a long time.  There are red streaks coming from the infected area.  Your red area gets larger.  Your red area turns darker. This information is not intended to replace advice given to you by your health care provider. Make sure you discuss any questions you have with your health care provider. Document  Released: 12/04/2007 Document Revised: 11/23/2015 Document Reviewed: 04/26/2015  2017 Elsevier

## 2016-06-27 NOTE — Progress Notes (Signed)
Pre visit review using our clinic review tool, if applicable. No additional management support is needed unless otherwise documented below in the visit note. 

## 2016-06-27 NOTE — Progress Notes (Signed)
Subjective:    Patient ID: Salley SlaughterCarlos Shiroma, male    DOB: [birth year], 45 y.o.   MRN: [id number]  HPI  Mr. Elly ModenaDagnesses is a 45 year old male who presents today with right index finger pain and swelling that started 3 days.  Pain is rated as a 6 and states this feels like his "heart beat" is noted in this area.  He reports that pain and redness is worse and has not improved. He reports "ripping his nails" and notes that symptoms started after noting an opening in his skin that is no longer noted. He denies fever, chills, sweats, N/V/D, drainage, or warmth. No recent antibiotic use. No treatment at home. No aggravating or alleviating factors noted.  Right hand palm has area of redness and scaly area that was noted approximately 2 to 3 months ago.  He reports seeing a derm provider who has provided him with Clobetasol ointment that has  provided excellent benefit. He states that area of redness has occurred again but he has not used clobetasol at this time. Associated pruritis and dryness is noted.    Review of Systems  Constitutional: Negative for chills, fatigue and fever.  Respiratory: Negative for cough, shortness of breath and wheezing.   Cardiovascular: Negative for chest pain and palpitations.  Gastrointestinal: Negative for abdominal pain, diarrhea, nausea and vomiting.  Skin:       Swelling and redness of right index finger  Redness on palm of right hand   Past Medical History:  Diagnosis Date  . ADHD (attention deficit hyperactivity disorder)   . Allergic rhinitis   . Asthma   . GERD (gastroesophageal reflux disease)   . Headache(784.0)   . Hypertension   . Positive PPD, treated      Social History   Social History  . Marital status: Married    Spouse name: N/A  . Number of children: N/A  . Years of education: N/A   Occupational History  . Not on file.   Social History Main Topics  . Smoking status: Never Smoker  . Smokeless tobacco: Never Used  . Alcohol use  0.0 oz/week     Comment: occ  . Drug use: No  . Sexual activity: Not on file   Other Topics Concern  . Not on file   Social History Narrative   Married   Occupation: Spanish- Recruitment consultantnglish translator          Past Surgical History:  Procedure Laterality Date  . No Surgical History      Family History  Problem Relation Age of Onset  . Coronary artery disease    . Diabetes    . Hyperlipidemia    . Hypertension    . Lung cancer    . Stroke      No Known Allergies  Current Outpatient Prescriptions on File Prior to Visit  Medication Sig Dispense Refill  . cetirizine (ZYRTEC) 10 MG tablet Take 10 mg by mouth daily.    . fluticasone (FLONASE) 50 MCG/ACT nasal spray Place 1 spray into both nostrils daily.    Marland Kitchen. omeprazole (PRILOSEC OTC) 20 MG tablet Take 20 mg by mouth as needed.    . valsartan (DIOVAN) 160 MG tablet TAKE ONE TABLET BY MOUTH ONE TIME DAILY 30 tablet 11  . amphetamine-dextroamphetamine (ADDERALL XR) 10 MG 24 hr capsule Take 1 capsule (10 mg total) by mouth daily as needed. 30 capsule 0  . montelukast (SINGULAIR) 10 MG tablet Take 1 tablet (10 mg total) by  mouth at bedtime. (Patient not taking: Reported on 06/27/2016) 90 tablet 3   No current facility-administered medications on file prior to visit.     BP 120/82 (BP Location: Left Arm, Patient Position: Sitting, Cuff Size: Normal)   Pulse 72   Temp 97.8 F (36.6 C) (Oral)   Wt 220 lb 12.8 oz (100.2 kg)   SpO2 98%   BMI 30.58 kg/m        Objective:   Physical Exam  Constitutional: He is oriented to person, place, and time. He appears well-developed and well-nourished.  Eyes: Pupils are equal, round, and reactive to light. No scleral icterus.  Cardiovascular: Normal rate and regular rhythm.   Pulmonary/Chest: Effort normal and breath sounds normal. He has no wheezes. He has no rales.  Neurological: He is alert and oriented to person, place, and time.  Skin: Skin is warm and dry.  Right index finger  exhibits erythema and mild edema around cuticle area. Erythema does not extend beyond this area.   Area of erythematous plaque on palm of right hand that is approximately quarter size in diameter. No evidence of infection or drainage noted.         Assessment & Plan:   1. Cellulitis of finger of right hand Exam and history with worsening symptoms support treatment for cellulitis. Advised use of warm compresses. Further advised him to follow up if symptoms are not improving with treatment and is symptoms worsen after 1 to 2 days of treatment or he notices any new symptoms such as joint or muscle pain. - cephALEXin (KEFLEX) 500 MG capsule; Take 1 capsule (500 mg total) by mouth 4 (four) times daily.  Dispense: 28 capsule; Refill:   2. Atopic dermatitis, unspecified type Advised him to initiate treatment with clobetasol that was provided by his dermatology provider and follow up for further evaluation and treatment if area on palm does not improve in 3 to 4 days, worsens, or he develops new symptoms. Further advised him to maintain moisture in this area with use of products such as Eucerin cream, Aveeno lotion, or cetaphil.  Recommended that he follow up with his PCP for regular care and lab work that has been previously ordered for him. He voiced understanding and agreed with plan.  Roddie McJulia Ginni Eichler, FNP-C

## 2016-07-19 ENCOUNTER — Other Ambulatory Visit: Payer: 59

## 2016-07-23 ENCOUNTER — Other Ambulatory Visit (INDEPENDENT_AMBULATORY_CARE_PROVIDER_SITE_OTHER): Payer: BLUE CROSS/BLUE SHIELD

## 2016-07-23 DIAGNOSIS — Z0000 Encounter for general adult medical examination without abnormal findings: Secondary | ICD-10-CM | POA: Diagnosis not present

## 2016-07-23 DIAGNOSIS — R7989 Other specified abnormal findings of blood chemistry: Secondary | ICD-10-CM | POA: Diagnosis not present

## 2016-07-23 LAB — HEPATIC FUNCTION PANEL
ALK PHOS: 79 U/L (ref 39–117)
ALT: 26 U/L (ref 0–53)
AST: 19 U/L (ref 0–37)
Albumin: 4.5 g/dL (ref 3.5–5.2)
BILIRUBIN DIRECT: 0.1 mg/dL (ref 0.0–0.3)
TOTAL PROTEIN: 6.6 g/dL (ref 6.0–8.3)
Total Bilirubin: 0.8 mg/dL (ref 0.2–1.2)

## 2016-07-23 LAB — CBC WITH DIFFERENTIAL/PLATELET
BASOS ABS: 0 10*3/uL (ref 0.0–0.1)
Basophils Relative: 0.2 % (ref 0.0–3.0)
EOS ABS: 0.2 10*3/uL (ref 0.0–0.7)
Eosinophils Relative: 3.7 % (ref 0.0–5.0)
HEMATOCRIT: 46.2 % (ref 39.0–52.0)
HEMOGLOBIN: 16 g/dL (ref 13.0–17.0)
LYMPHS PCT: 30.5 % (ref 12.0–46.0)
Lymphs Abs: 1.9 10*3/uL (ref 0.7–4.0)
MCHC: 34.6 g/dL (ref 30.0–36.0)
MCV: 88.4 fl (ref 78.0–100.0)
Monocytes Absolute: 0.4 10*3/uL (ref 0.1–1.0)
Monocytes Relative: 7.3 % (ref 3.0–12.0)
NEUTROS ABS: 3.6 10*3/uL (ref 1.4–7.7)
Neutrophils Relative %: 58.3 % (ref 43.0–77.0)
PLATELETS: 201 10*3/uL (ref 150.0–400.0)
RBC: 5.22 Mil/uL (ref 4.22–5.81)
RDW: 13.1 % (ref 11.5–15.5)
WBC: 6.1 10*3/uL (ref 4.0–10.5)

## 2016-07-23 LAB — BASIC METABOLIC PANEL
BUN: 15 mg/dL (ref 6–23)
CALCIUM: 9.4 mg/dL (ref 8.4–10.5)
CO2: 32 mEq/L (ref 19–32)
CREATININE: 1.08 mg/dL (ref 0.40–1.50)
Chloride: 99 mEq/L (ref 96–112)
GFR: 78.97 mL/min (ref >60.00)
Glucose, Bld: 96 mg/dL (ref 70–99)
Potassium: 3.9 mEq/L (ref 3.5–5.1)
Sodium: 138 mEq/L (ref 135–145)

## 2016-07-23 LAB — TSH: TSH: 1.53 u[IU]/mL (ref 0.35–4.50)

## 2016-07-23 LAB — POC URINALSYSI DIPSTICK (AUTOMATED)
BILIRUBIN UA: NEGATIVE
Glucose, UA: NEGATIVE
KETONES UA: NEGATIVE
Leukocytes, UA: NEGATIVE
NITRITE UA: NEGATIVE
PH UA: 7
Protein, UA: NEGATIVE
RBC UA: NEGATIVE
SPEC GRAV UA: 1.02
Urobilinogen, UA: 0.2

## 2016-07-23 LAB — LIPID PANEL
Cholesterol: 212 mg/dL — ABNORMAL HIGH (ref 0–200)
HDL: 48.5 mg/dL (ref >39.00)
NONHDL: 163.25
Total CHOL/HDL Ratio: 4
Triglycerides: 214 mg/dL — ABNORMAL HIGH (ref 0.0–149.0)
VLDL: 42.8 mg/dL — AB (ref 0.0–40.0)

## 2016-07-23 LAB — LDL CHOLESTEROL, DIRECT: Direct LDL: 116 mg/dL

## 2016-07-31 ENCOUNTER — Ambulatory Visit (INDEPENDENT_AMBULATORY_CARE_PROVIDER_SITE_OTHER): Payer: BLUE CROSS/BLUE SHIELD | Admitting: Family Medicine

## 2016-07-31 ENCOUNTER — Encounter: Payer: Self-pay | Admitting: Family Medicine

## 2016-07-31 VITALS — BP 128/72 | HR 75 | Temp 98.2°F | Ht 71.0 in | Wt 225.0 lb

## 2016-07-31 DIAGNOSIS — Z0000 Encounter for general adult medical examination without abnormal findings: Secondary | ICD-10-CM

## 2016-07-31 MED ORDER — AMPHETAMINE-DEXTROAMPHET ER 10 MG PO CP24: 10.0000 mg | ORAL_CAPSULE | Freq: Every day | ORAL | 0 refills | Status: DC | PRN

## 2016-07-31 NOTE — Progress Notes (Signed)
   Subjective:    Patient ID: Derek Love, male    DOB: [birth year], 46 y.o.   MRN: [id number]  HPI 46 yr old male for a well exam. He feels well. He was recently seen for eczema on the right hand and a paronychia on a right hand finger. These were treated with Clobetasol and Keflex.    Review of Systems  Constitutional: Negative.   HENT: Negative.   Eyes: Negative.   Respiratory: Negative.   Cardiovascular: Negative.   Gastrointestinal: Negative.   Genitourinary: Negative.   Musculoskeletal: Negative.   Skin: Negative.   Neurological: Negative.   Psychiatric/Behavioral: Negative.        Objective:   Physical Exam  Constitutional: He is oriented to person, place, and time. He appears well-developed and well-nourished. No distress.  HENT:  Head: Normocephalic and atraumatic.  Right Ear: External ear normal.  Left Ear: External ear normal.  Nose: Nose normal.  Mouth/Throat: Oropharynx is clear and moist. No oropharyngeal exudate.  Eyes: Conjunctivae and EOM are normal. Pupils are equal, round, and reactive to light. Right eye exhibits no discharge. Left eye exhibits no discharge. No scleral icterus.  Neck: Neck supple. No JVD present. No tracheal deviation present. No thyromegaly present.  Cardiovascular: Normal rate, regular rhythm, normal heart sounds and intact distal pulses.  Exam reveals no gallop and no friction rub.   No murmur heard. Pulmonary/Chest: Effort normal and breath sounds normal. No respiratory distress. He has no wheezes. He has no rales. He exhibits no tenderness.  Abdominal: Soft. Bowel sounds are normal. He exhibits no distension and no mass. There is no tenderness. There is no rebound and no guarding.  Genitourinary: Rectum normal, prostate normal and penis normal. Rectal exam shows guaiac negative stool. No penile tenderness.  Musculoskeletal: Normal range of motion. He exhibits no edema or tenderness.  Lymphadenopathy:    He has no cervical  adenopathy.  Neurological: He is alert and oriented to person, place, and time. He has normal reflexes. No cranial nerve deficit. He exhibits normal muscle tone. Coordination normal.  Skin: Skin is warm and dry. No rash noted. He is not diaphoretic. No erythema. No pallor.  Psychiatric: He has a normal mood and affect. His behavior is normal. Judgment and thought content normal.          Assessment & Plan:  Well exam. We discussed diet and exercise.  Gershon CraneStephen Fry, MD

## 2016-10-20 ENCOUNTER — Other Ambulatory Visit: Payer: Self-pay | Admitting: Family Medicine

## 2016-10-22 ENCOUNTER — Other Ambulatory Visit: Payer: Self-pay | Admitting: Family Medicine

## 2017-02-14 ENCOUNTER — Telehealth: Payer: Self-pay | Admitting: Family Medicine

## 2017-02-14 MED ORDER — LOSARTAN POTASSIUM 50 MG PO TABS: 50.0000 mg | ORAL_TABLET | Freq: Every day | ORAL | 3 refills | Status: DC

## 2017-02-14 NOTE — Telephone Encounter (Signed)
I sent script e[facility]scribe to [facility], spoke with pt and removed Valsartan from current medication list.

## 2017-02-14 NOTE — Telephone Encounter (Signed)
Change this to Losartan 50 mg to take daily, call in #90 with 3 rf

## 2017-02-14 NOTE — Telephone Encounter (Signed)
° ° ° °  Pt said pharmacy call and told him the below med was recalled and he need an RX for something else    valsartan (DIOVAN) 160 MG tablet

## 2017-03-13 ENCOUNTER — Telehealth: Payer: Self-pay | Admitting: Family Medicine

## 2017-03-13 NOTE — Telephone Encounter (Signed)
Okay. Cancel Losartan and call in Benicar 40 mg daily, #90 with 3 rf

## 2017-03-13 NOTE — Telephone Encounter (Signed)
° ° ° °  Pt call to say the below med is making him sick  Headache, lightheadedness  losartan (COZAAR) 50 MG tablet    Pt ask if he can go back on a previous med that he use to take  Benicar   Pharmacy   [facility] [place]

## 2017-03-14 MED ORDER — OLMESARTAN MEDOXOMIL 40 MG PO TABS: 40.0000 mg | ORAL_TABLET | Freq: Every day | ORAL | 3 refills | Status: DC

## 2017-03-14 NOTE — Telephone Encounter (Signed)
I spoke with pt and sent new script e-scribe to [facility], also removed Losartan from current list.

## 2017-09-18 ENCOUNTER — Telehealth: Payer: Self-pay

## 2017-09-18 NOTE — Telephone Encounter (Signed)
Fax from [facility] for olmesartan is on backorder   They have NO release date when medication will be back in stock  Will need an alternative sent into the pharmacy for the pt.   Sent to PCP to advise

## 2017-09-18 NOTE — Telephone Encounter (Signed)
Change to Losartan 50 mg daily, call in #90 with 3 rf

## 2017-09-19 MED ORDER — OLMESARTAN MEDOXOMIL 40 MG PO TABS: 40.0000 mg | ORAL_TABLET | Freq: Every day | ORAL | 3 refills | Status: DC

## 2017-09-19 NOTE — Telephone Encounter (Signed)
Called and spoke to pt. Pt would like to stay on the same Rx just RE-send [facility]. Advised pt to call us back if [facility] doesn't have it in stock either. Pt has been with medication for 2 days pt stated that he is starting to feel funny.

## 2017-11-12 ENCOUNTER — Other Ambulatory Visit: Payer: Self-pay | Admitting: *Deleted

## 2017-11-12 MED ORDER — MONTELUKAST SODIUM 10 MG PO TABS: 10.0000 mg | ORAL_TABLET | Freq: Every day | ORAL | 3 refills | Status: DC

## 2017-11-12 NOTE — Telephone Encounter (Signed)
Fax request received for singulair. Medication filled to pharmacy as requested.

## 2018-05-22 ENCOUNTER — Other Ambulatory Visit: Payer: Self-pay | Admitting: Family Medicine

## 2018-05-22 ENCOUNTER — Ambulatory Visit: Payer: Self-pay | Admitting: *Deleted

## 2018-05-22 MED ORDER — OLMESARTAN MEDOXOMIL 40 MG PO TABS: 40.0000 mg | ORAL_TABLET | Freq: Every day | ORAL | 0 refills | Status: DC

## 2018-05-22 NOTE — Telephone Encounter (Signed)
Copied from CRM [id number]. Topic: Quick Communication - Rx Refill/Question >> May 22, 2018  3:18 PM Fanny BienIlderton, Jessica L wrote: Medication:olmesartan (BENICAR) 40 MG tablet [[id number]][[id number]]   Has the patient contacted their pharmacy?yes Preferred Pharmacy (with phone number or street name):[facility] [address] - [place], [place] - [address] [phone] (Phone) [phone] (Fax)    Agent: Please be advised that RX refills may take up to 3 business days. We ask that you follow-up with your pharmacy.

## 2018-05-22 NOTE — Telephone Encounter (Signed)
Call to patient- will send in courtesy refill - he has appointment next week.  Message from Fanny BienJessica L Ilderton sent at 05/22/2018 3:23 PM EST   Summary: out of medication    Pt called and stated that he is out of blood pressure medication and called refill in today. Pt would like to know what he should do for the next few days. Please advise

## 2018-05-22 NOTE — Telephone Encounter (Signed)
Call to patient- he request 30 day if 90 day can not be filled- [facility]. Patient to keep appointment next week.  Requested Prescriptions  Pending Prescriptions Disp Refills  . olmesartan (BENICAR) 40 MG tablet 90 tablet 3    Sig: Take 1 tablet (40 mg total) by mouth daily.     Cardiovascular:  Angiotensin Receptor Blockers Failed - 05/22/2018  3:25 PM      Failed - Cr in normal range and within 180 days    Creatinine, Ser  Date Value Ref Range Status  07/23/2016 1.08 0.40 - 1.50 mg/dL Final         Failed - K in normal range and within 180 days    Potassium  Date Value Ref Range Status  07/23/2016 3.9 3.5 - 5.1 mEq/L Final         Failed - Valid encounter within last 6 months    Recent Outpatient Visits          1 year ago [facility]   [facility], Tera MaterStephen A, MD   1 year ago Cellulitis of finger of right hand   [facility], [place], FNP   2 years ago Palpitations   [facility] Damita LackHannah R, DO   2 years ago [facility] health care   [facility], Tera MaterStephen A, MD   3 years ago Acute maxillary sinusitis, recurrence not specified   [facility], Tera MaterStephen A, MD      Future Appointments            In 4 days Nelwyn SalisburyFry, Stephen A, MD [facility]           Passed - Patient is not pregnant      Passed - Last BP in normal range    BP Readings from Last 1 Encounters:  07/31/16 128/72

## 2018-05-26 ENCOUNTER — Ambulatory Visit: Payer: BLUE CROSS/BLUE SHIELD | Admitting: Family Medicine

## 2018-06-02 ENCOUNTER — Ambulatory Visit (INDEPENDENT_AMBULATORY_CARE_PROVIDER_SITE_OTHER): Payer: Self-pay | Admitting: Family Medicine

## 2018-06-02 ENCOUNTER — Encounter: Payer: Self-pay | Admitting: Family Medicine

## 2018-06-02 VITALS — BP 118/78 | HR 59 | Temp 98.1°F | Wt 216.4 lb

## 2018-06-02 DIAGNOSIS — L309 Dermatitis, unspecified: Secondary | ICD-10-CM

## 2018-06-02 DIAGNOSIS — I10 Essential (primary) hypertension: Secondary | ICD-10-CM

## 2018-06-02 MED ORDER — OLMESARTAN MEDOXOMIL 40 MG PO TABS: 40.0000 mg | ORAL_TABLET | Freq: Every day | ORAL | 3 refills | Status: DC

## 2018-06-02 MED ORDER — HALOBETASOL PROPIONATE 0.05 % EX OINT: TOPICAL_OINTMENT | Freq: Two times a day (BID) | CUTANEOUS | 2 refills | Status: DC

## 2018-06-02 NOTE — Progress Notes (Signed)
   Subjective:    Patient ID: Derek SlaughterCarlos Love, male    DOB: Feb 04, 1971, 47 y.o.   MRN: [id number]  HPI Here to follow up HTN and to ask about eczema. His BP has been stable at home, running around 130s over 80s. He has been using an OTC cream for the eczema with mixed results.    Review of Systems  Constitutional: Negative.   Respiratory: Negative.   Cardiovascular: Negative.   Skin: Positive for rash.  Neurological: Negative.        Objective:   Physical Exam  Constitutional: He appears well-developed and well-nourished.  Cardiovascular: Normal rate, regular rhythm, normal heart sounds and intact distal pulses.  Pulmonary/Chest: Effort normal and breath sounds normal. No stridor. No respiratory distress. He has no wheezes. He has no rales.  Musculoskeletal: He exhibits no edema.  Skin:  The left thumb has an area of red, scaly skin           Assessment & Plan:  His HTN is stable. Benicar was refilled. He will try Halobetasol ointment for the eczema. He will return soon for a well exam and labs.  Gershon CraneStephen Fry, MD

## 2018-07-03 ENCOUNTER — Encounter: Payer: Self-pay | Admitting: Family Medicine

## 2018-08-24 ENCOUNTER — Ambulatory Visit (INDEPENDENT_AMBULATORY_CARE_PROVIDER_SITE_OTHER): Payer: 59 | Admitting: Family Medicine

## 2018-08-24 ENCOUNTER — Other Ambulatory Visit: Payer: Self-pay

## 2018-08-24 ENCOUNTER — Encounter: Payer: Self-pay | Admitting: Family Medicine

## 2018-08-24 VITALS — BP 122/74 | HR 66 | Temp 98.0°F | Ht 71.0 in | Wt 220.0 lb

## 2018-08-24 DIAGNOSIS — Z23 Encounter for immunization: Secondary | ICD-10-CM | POA: Diagnosis not present

## 2018-08-24 DIAGNOSIS — Z09 Encounter for follow-up examination after completed treatment for conditions other than malignant neoplasm: Secondary | ICD-10-CM

## 2018-08-24 DIAGNOSIS — K219 Gastro-esophageal reflux disease without esophagitis: Secondary | ICD-10-CM

## 2018-08-24 DIAGNOSIS — Z0000 Encounter for general adult medical examination without abnormal findings: Secondary | ICD-10-CM

## 2018-08-24 DIAGNOSIS — Z131 Encounter for screening for diabetes mellitus: Secondary | ICD-10-CM

## 2018-08-24 DIAGNOSIS — G8929 Other chronic pain: Secondary | ICD-10-CM

## 2018-08-24 DIAGNOSIS — R1011 Right upper quadrant pain: Secondary | ICD-10-CM

## 2018-08-24 LAB — POCT URINALYSIS DIP (MANUAL ENTRY)
Bilirubin, UA: NEGATIVE
Blood, UA: NEGATIVE
Glucose, UA: NEGATIVE mg/dL
Ketones, POC UA: NEGATIVE mg/dL
Leukocytes, UA: NEGATIVE
Nitrite, UA: NEGATIVE
Protein Ur, POC: NEGATIVE mg/dL
Spec Grav, UA: 1.01 (ref 1.010–1.025)
Urobilinogen, UA: 0.2 E.U./dL
pH, UA: 7 (ref 5.0–8.0)

## 2018-08-24 LAB — POCT GLYCOSYLATED HEMOGLOBIN (HGB A1C): Hemoglobin A1C: 5.4 % (ref 4.0–5.6)

## 2018-08-24 MED ORDER — PANTOPRAZOLE SODIUM 20 MG PO TBEC: 20.0000 mg | DELAYED_RELEASE_TABLET | Freq: Every day | ORAL | 2 refills | Status: DC

## 2018-08-24 NOTE — Progress Notes (Signed)
[facility]  New Patient--Establish Care  Subjective:  Patient ID: Derek Love, male    DOB: [birth year]  Age: 48 y.o. MRN: [id number]  CC:  Chief Complaint  Patient presents with  . Establish Care  . Flank Pain    right   . Heartburn  . Emesis    HPI Derek Love is 83 male who presents for Follow Up.   Past Medical History:  Diagnosis Date  . ADHD (attention deficit hyperactivity disorder)   . Allergic rhinitis   . Asthma   . GERD (gastroesophageal reflux disease)   . Headache(784.0)   . Hypertension   . Positive PPD, treated    Current Status: Since his last office visit, he is doing well with no complaints. He is a previous patient of Dr. Abran Cantor and [facility] in [place]. He has pain been having upper right quadrant pain, which radiates to right flank and back area. He states that he has been having this pain and discomfort more frequently lately. He has been having nausea and vomiting. No reports of GI problems such as nausea, vomiting, diarrhea, and constipation. He has no reports of blood in stools, dysuria and hematuria. He denies visual changes, chest pain, cough, shortness of breath, heart palpitations, and falls. He has occasional headaches and dizziness with position changes. Denies severe headaches, confusion, seizures, double vision, and blurred vision, nausea and vomiting.  He denies fevers, chills, fatigue, recent infections, weight loss, and night sweats. No depression or anxiety reported. He denies pain today.   Past Surgical History:  Procedure Laterality Date  . No Surgical History      Family History  Problem Relation Age of Onset  . Coronary artery disease Other   . Diabetes Other   . Hyperlipidemia Other   . Hypertension Other   . Lung cancer Other   . Stroke Other   . Stroke Father   . Hypertrophic cardiomyopathy Sister     Social History   Socioeconomic History  . Marital status:  Married    Spouse name: Not on file  . Number of children: Not on file  . Years of education: Not on file  . Highest education level: Not on file  Occupational History  . Not on file  Social Needs  . Financial resource strain: Not on file  . Food insecurity:    Worry: Not on file    Inability: Not on file  . Transportation needs:    Medical: Not on file    Non-medical: Not on file  Tobacco Use  . Smoking status: Never Smoker  . Smokeless tobacco: Never Used  Substance and Sexual Activity  . Alcohol use: Yes    Alcohol/week: 0.0 standard drinks    Comment: occ  . Drug use: No  . Sexual activity: Not on file  Lifestyle  . Physical activity:    Days per week: Not on file    Minutes per session: Not on file  . Stress: Not on file  Relationships  . Social connections:    Talks on phone: Not on file    Gets together: Not on file    Attends religious service: Not on file    Active member of club or organization: Not on file    Attends meetings of clubs or organizations: Not on file    Relationship status: Not on file  . Intimate partner violence:    Fear of current or ex partner: Not on  file    Emotionally abused: Not on file    Physically abused: Not on file    Forced sexual activity: Not on file  Other Topics Concern  . Not on file  Social History Narrative   Married   Occupation: Spanish- Recruitment consultant          Outpatient Medications Prior to Visit  Medication Sig Dispense Refill  . fexofenadine (ALLEGRA) 180 MG tablet Take 180 mg by mouth daily.    . fluticasone (FLONASE) 50 MCG/ACT nasal spray Place 1 spray into both nostrils daily.    . halobetasol (ULTRAVATE) 0.05 % ointment Apply topically 2 (two) times daily. 50 g 2  . montelukast (SINGULAIR) 10 MG tablet Take 1 tablet (10 mg total) by mouth at bedtime. 90 tablet 3  . olmesartan (BENICAR) 40 MG tablet Take 1 tablet (40 mg total) by mouth daily. 90 tablet 3  . omeprazole (PRILOSEC) 20 MG capsule Take 20  mg by mouth daily.    . lansoprazole (PREVACID) 15 MG capsule Take 15 mg by mouth daily at 12 noon.     No facility-administered medications prior to visit.     No Known Allergies  ROS Review of Systems  Constitutional: Negative.   HENT: Negative.   Eyes: Negative.   Respiratory: Negative.   Cardiovascular: Negative.   Gastrointestinal: Positive for abdominal pain (right upper quadrant).  Endocrine: Negative.   Genitourinary: Negative.   Musculoskeletal: Negative.   Skin: Negative.   Allergic/Immunologic: Negative.   Neurological: Negative.   Hematological: Negative.   Psychiatric/Behavioral: Negative.    Objective:    Physical Exam  Constitutional: He is oriented to person, place, and time. He appears well-developed and well-nourished.  HENT:  Head: Normocephalic and atraumatic.  Eyes: Conjunctivae are normal.  Neck: Normal range of motion. Neck supple.  Cardiovascular: Normal rate, regular rhythm, normal heart sounds and intact distal pulses.  Pulmonary/Chest: Effort normal and breath sounds normal.  Abdominal: Soft. Bowel sounds are normal. There is abdominal tenderness (right upper quadrant ). There is guarding (right upper quadrant).  Musculoskeletal: Normal range of motion.  Neurological: He is alert and oriented to person, place, and time. He has normal reflexes.  Skin: Skin is warm and dry.  Psychiatric: He has a normal mood and affect. His behavior is normal. Judgment and thought content normal.  Nursing note and vitals reviewed.  BP 122/74 (BP Location: Left Arm, Patient Position: Sitting, Cuff Size: Large)   Pulse 66   Temp 98 F (36.7 C) (Oral)   Ht 5\' 11"  (1.803 m)   Wt 220 lb (99.8 kg)   SpO2 97%   BMI 30.68 kg/m  Wt Readings from Last 3 Encounters:  08/24/18 220 lb (99.8 kg)  06/02/18 216 lb 6 oz (98.1 kg)  07/31/16 225 lb (102.1 kg)    Health Maintenance Due  Topic Date Due  . HIV Screening  08/22/1987    There are no preventive care  reminders to display for this patient.  Lab Results  Component Value Date   TSH 1.510 08/24/2018   Lab Results  Component Value Date   WBC 6.1 08/24/2018   HGB 15.6 08/24/2018   HCT 44.6 08/24/2018   MCV 89 08/24/2018   PLT 199 08/24/2018   Lab Results  Component Value Date   NA 141 08/24/2018   K 4.7 08/24/2018   CO2 25 08/24/2018   GLUCOSE 90 08/24/2018   BUN 11 08/24/2018   CREATININE 1.05 08/24/2018   BILITOT 0.5  08/24/2018   ALKPHOS 76 08/24/2018   AST 20 08/24/2018   ALT 28 08/24/2018   PROT 6.4 08/24/2018   ALBUMIN 4.7 08/24/2018   CALCIUM 9.4 08/24/2018   GFR 78.97 07/23/2016   Lab Results  Component Value Date   CHOL 194 08/24/2018   Lab Results  Component Value Date   HDL 44 08/24/2018   Lab Results  Component Value Date   LDLCALC 111 (H) 08/24/2018   Lab Results  Component Value Date   TRIG 197 (H) 08/24/2018   Lab Results  Component Value Date   CHOLHDL 4.4 08/24/2018   Lab Results  Component Value Date   HGBA1C 5.4 08/24/2018   Assessment & Plan:   1. Chronic right upper quadrant pain - US Abdomen Limited RUQ; Future  2. Gastroesophageal reflux disease without esophagitis We will initiate Pantoprazole today.  - pantoprazole (PROTONIX) 20 MG tablet; Take 1 tablet (20 mg total) by mouth daily.  Dispense: 30 tablet; Refill: 2  3. Screening for diabetes mellitus Hgb A1c is in normal range today.  He will continue to decrease foods/beverages high in sugars and carbs and follow Heart Healthy or DASH diet. Increase physical activity to at least 30 minutes cardio exercise daily.  - POCT glycosylated hemoglobin (Hb A1C) - POCT urinalysis dipstick  4. Healthcare maintenance - CBC with Differential - Comprehensive metabolic panel - TSH - Lipid Panel - Vitamin D 1,25 dihydroxy - PSA - Vitamin B12  5. Need for Tdap vaccination - Tdap vaccine greater than or equal to 7yo IM  6. Follow up He will follow up in 1 month.   Meds ordered  this encounter  Medications  . pantoprazole (PROTONIX) 20 MG tablet    Sig: Take 1 tablet (20 mg total) by mouth daily.    Dispense:  30 tablet    Refill:  2    Orders Placed This Encounter  Procedures  . US Abdomen Limited RUQ  . Tdap vaccine greater than or equal to 7yo IM  . CBC with Differential  . Comprehensive metabolic panel  . TSH  . Lipid Panel  . Vitamin D 1,25 dihydroxy  . PSA  . Vitamin B12  . POCT glycosylated hemoglobin (Hb A1C)  . POCT urinalysis dipstick    Referral Orders  No referral(s) requested today    Raliegh Ip,  MSN, FNP-C [facility] [address] [place], [place] [address] [phone]  Problem List Items Addressed This Visit      Digestive   GERD   Relevant Medications   omeprazole (PRILOSEC) 20 MG capsule   pantoprazole (PROTONIX) 20 MG tablet    Other Visit Diagnoses    Chronic right upper quadrant pain    -  Primary   Relevant Orders   US Abdomen Limited RUQ   Screening for diabetes mellitus       Relevant Orders   POCT glycosylated hemoglobin (Hb A1C) (Completed)   POCT urinalysis dipstick (Completed)   Healthcare maintenance       Relevant Orders   CBC with Differential (Completed)   Comprehensive metabolic panel (Completed)   TSH (Completed)   Lipid Panel (Completed)   Vitamin D 1,25 dihydroxy (Completed)   PSA (Completed)   Vitamin B12 (Completed)   Need for Tdap vaccination       Relevant Orders   Tdap vaccine greater than or equal to 7yo IM (Completed)   Follow up  Meds ordered this encounter  Medications  . pantoprazole (PROTONIX) 20 MG tablet    Sig: Take 1 tablet (20 mg total) by mouth daily.    Dispense:  30 tablet    Refill:  2    Follow-up: Return in about 1 month (around 09/22/2018).    Kallie Locks, FNP

## 2018-08-25 DIAGNOSIS — G8929 Other chronic pain: Secondary | ICD-10-CM | POA: Insufficient documentation

## 2018-08-25 DIAGNOSIS — R1011 Right upper quadrant pain: Principal | ICD-10-CM

## 2018-08-26 ENCOUNTER — Ambulatory Visit (HOSPITAL_COMMUNITY)
Admission: RE | Admit: 2018-08-26 | Discharge: 2018-08-26 | Disposition: A | Discharge status: Home / Self Care | Payer: 59 | Source: Ambulatory Visit | Attending: Family Medicine | Admitting: Family Medicine

## 2018-08-26 DIAGNOSIS — R1011 Right upper quadrant pain: Secondary | ICD-10-CM | POA: Insufficient documentation

## 2018-08-26 DIAGNOSIS — G8929 Other chronic pain: Secondary | ICD-10-CM | POA: Insufficient documentation

## 2018-08-30 ENCOUNTER — Encounter: Payer: Self-pay | Admitting: Family Medicine

## 2018-08-30 LAB — COMPREHENSIVE METABOLIC PANEL
ALT: 28 IU/L (ref 0–44)
AST: 20 IU/L (ref 0–40)
Albumin/Globulin Ratio: 2.8 — ABNORMAL HIGH (ref 1.2–2.2)
Albumin: 4.7 g/dL (ref 4.0–5.0)
Alkaline Phosphatase: 76 IU/L (ref 39–117)
BUN/Creatinine Ratio: 10 (ref 9–20)
BUN: 11 mg/dL (ref 6–24)
Bilirubin Total: 0.5 mg/dL (ref 0.0–1.2)
CO2: 25 mmol/L (ref 20–29)
Calcium: 9.4 mg/dL (ref 8.7–10.2)
Chloride: 100 mmol/L (ref 96–106)
Creatinine, Ser: 1.05 mg/dL (ref 0.76–1.27)
GFR calc Af Amer: 98 mL/min/{1.73_m2} (ref >59)
GFR calc non Af Amer: 85 mL/min/{1.73_m2} (ref >59)
Globulin, Total: 1.7 g/dL (ref 1.5–4.5)
Glucose: 90 mg/dL (ref 65–99)
Potassium: 4.7 mmol/L (ref 3.5–5.2)
Sodium: 141 mmol/L (ref 134–144)
Total Protein: 6.4 g/dL (ref 6.0–8.5)

## 2018-08-30 LAB — VITAMIN D 1,25 DIHYDROXY
Vitamin D 1, 25 (OH)2 Total: 54 pg/mL
Vitamin D2 1, 25 (OH)2: <10 pg/mL
Vitamin D3 1, 25 (OH)2: 52 pg/mL

## 2018-08-30 LAB — CBC WITH DIFFERENTIAL/PLATELET
Basophils Absolute: 0.1 10*3/uL (ref 0.0–0.2)
Basos: 1 %
EOS (ABSOLUTE): 0.3 10*3/uL (ref 0.0–0.4)
Eos: 5 %
Hematocrit: 44.6 % (ref 37.5–51.0)
Hemoglobin: 15.6 g/dL (ref 13.0–17.7)
Immature Grans (Abs): 0 10*3/uL (ref 0.0–0.1)
Immature Granulocytes: 0 %
Lymphocytes Absolute: 1.9 10*3/uL (ref 0.7–3.1)
Lymphs: 31 %
MCH: 31 pg (ref 26.6–33.0)
MCHC: 35 g/dL (ref 31.5–35.7)
MCV: 89 fL (ref 79–97)
Monocytes Absolute: 0.4 10*3/uL (ref 0.1–0.9)
Monocytes: 7 %
Neutrophils Absolute: 3.5 10*3/uL (ref 1.4–7.0)
Neutrophils: 56 %
Platelets: 199 10*3/uL (ref 150–450)
RBC: 5.04 x10E6/uL (ref 4.14–5.80)
RDW: 12.7 % (ref 11.6–15.4)
WBC: 6.1 10*3/uL (ref 3.4–10.8)

## 2018-08-30 LAB — LIPID PANEL
Chol/HDL Ratio: 4.4 ratio (ref 0.0–5.0)
Cholesterol, Total: 194 mg/dL (ref 100–199)
HDL: 44 mg/dL (ref >39)
LDL Calculated: 111 mg/dL — ABNORMAL HIGH (ref 0–99)
Triglycerides: 197 mg/dL — ABNORMAL HIGH (ref 0–149)
VLDL Cholesterol Cal: 39 mg/dL (ref 5–40)

## 2018-08-30 LAB — VITAMIN B12: Vitamin B-12: 437 pg/mL (ref 232–1245)

## 2018-08-30 LAB — TSH: TSH: 1.51 u[IU]/mL (ref 0.450–4.500)

## 2018-08-30 LAB — PSA: Prostate Specific Ag, Serum: 0.4 ng/mL (ref 0.0–4.0)

## 2018-08-31 ENCOUNTER — Telehealth: Payer: Self-pay

## 2018-08-31 NOTE — Telephone Encounter (Signed)
Patient calling regarding [place] results

## 2018-09-22 ENCOUNTER — Telehealth: Payer: 59 | Admitting: Family Medicine

## 2018-09-22 ENCOUNTER — Ambulatory Visit: Payer: 59 | Admitting: Family Medicine

## 2018-09-24 ENCOUNTER — Encounter: Payer: Self-pay | Admitting: Family Medicine

## 2018-09-25 ENCOUNTER — Ambulatory Visit: Payer: 59 | Admitting: Family Medicine

## 2018-09-28 ENCOUNTER — Other Ambulatory Visit: Payer: Self-pay

## 2018-09-28 ENCOUNTER — Encounter: Payer: Self-pay | Admitting: Family Medicine

## 2018-09-28 ENCOUNTER — Ambulatory Visit (INDEPENDENT_AMBULATORY_CARE_PROVIDER_SITE_OTHER): Payer: 59 | Admitting: Family Medicine

## 2018-09-28 DIAGNOSIS — L989 Disorder of the skin and subcutaneous tissue, unspecified: Secondary | ICD-10-CM | POA: Insufficient documentation

## 2018-09-28 DIAGNOSIS — Z09 Encounter for follow-up examination after completed treatment for conditions other than malignant neoplasm: Secondary | ICD-10-CM

## 2018-09-28 DIAGNOSIS — J302 Other seasonal allergic rhinitis: Secondary | ICD-10-CM

## 2018-09-28 MED ORDER — OLOPATADINE HCL 0.2 % OP SOLN: OPHTHALMIC | 1 refills | Status: DC

## 2018-09-28 NOTE — Progress Notes (Addendum)
Virtual Visit via Telephone Note  I connected with Derek Love on 09/28/18 at 10:40 AM EDT by telephone and verified that I am speaking with the correct person using two identifiers.   I discussed the limitations, risks, security and privacy concerns of performing an evaluation and management service by telephone and the availability of in person appointments. I also discussed with the patient that there may be a patient responsible charge related to this service. The patient expressed understanding and agreed to proceed.  History of Present Illness: Past Medical History:  Diagnosis Date  . ADHD (attention deficit hyperactivity disorder)   . Allergic rhinitis   . Asthma   . GERD (gastroesophageal reflux disease)   . Headache(784.0)   . Hypertension   . Positive PPD, treated     Current Status: Since his last office visit, he is doing well with no complaints. He has psoriasis on his fingers, which he states he has been using Halobetasol and Bacitracin for relief. He has noticed that his skin has began to thin in this area, so he has been covering area with Band-Aid. He has been using Singulair,Flonase, and Zyrtec for allergies symptoms. He continues to have increased symptoms of eye irritation.   He denies fevers, chills, fatigue, recent infections, weight loss, and night sweats. He has not had any headaches, visual changes, dizziness, and falls. No chest pain, heart palpitations, cough and shortness of breath reported. No reports of GI problems such as nausea, vomiting, diarrhea, and constipation. He has no reports of blood in stools, dysuria and hematuria. No depression or anxiety reported.   Past Surgical History:  Procedure Laterality Date  . No Surgical History      Observations/Objective:  Telephone Virtual Visit   Assessment and Plan:  1. Psoriasis-like skin disease OTC creams and ointment are not effective. We will refer him to Dermatologist today.   2. Seasonal  allergies We will initiate Pataday eye drops today.  - Olopatadine HCl (PATADAY) 0.2 % SOLN; Place 1 drop into each eye once daily.  Dispense: 3 mL; Refill: 1  3. Follow up He will keep previously scheduled follow up appointment scheduled for Annual Physical in 09/2018.   Follow Up Instructions:  I discussed the assessment and treatment plan with the patient. The patient was provided an opportunity to ask questions and all were answered. The patient agreed with the plan and demonstrated an understanding of the instructions.   The patient was advised to call back or seek an in-person evaluation if the symptoms worsen or if the condition fails to improve as anticipated.  I provided 20 minutes of non-face-to-face time during this encounter.   Kallie Locks, FNP

## 2018-10-21 ENCOUNTER — Ambulatory Visit: Payer: 59 | Admitting: Family Medicine

## 2018-10-23 ENCOUNTER — Telehealth: Payer: Self-pay | Admitting: Medical

## 2018-10-23 NOTE — Telephone Encounter (Signed)
Copied from CRM [id number]. Topic: Appointment Scheduling - New Patient >> Oct 23, 2018 10:41 AM Louie Bun, Rosey Bath D wrote: New patient has not been scheduled for your office. Provider: [place] Date of Appointment:Patient can take calls from 8am-12pm  Route to department's PEC pool. Tried to call office but couldn't get through.  LVM for pt to call and schedule VOV as a new pt.

## 2018-10-27 ENCOUNTER — Telehealth: Payer: Self-pay | Admitting: Medical

## 2018-10-27 NOTE — Telephone Encounter (Signed)
[facility] CRM  10/23/18 that patient wanted to be seen as New PT with Kristine Garbe. Called left msg for patient to call us back..ssc 10/27/18

## 2018-11-19 ENCOUNTER — Other Ambulatory Visit: Payer: Self-pay | Admitting: Family Medicine

## 2018-11-25 ENCOUNTER — Encounter: Payer: Self-pay | Admitting: Family Medicine

## 2018-11-25 ENCOUNTER — Other Ambulatory Visit: Payer: Self-pay

## 2018-11-25 MED ORDER — MONTELUKAST SODIUM 10 MG PO TABS: 10.0000 mg | ORAL_TABLET | Freq: Every day | ORAL | 3 refills | Status: DC

## 2019-02-09 ENCOUNTER — Telehealth: Payer: Self-pay | Admitting: Family Medicine

## 2019-02-09 NOTE — Telephone Encounter (Signed)
I'm ok with transfer.

## 2019-02-11 NOTE — Telephone Encounter (Signed)
I will accept him but will usually make official transfer on day they show up. Will send note to staff to get him scheduled. Thanks.

## 2019-02-16 ENCOUNTER — Ambulatory Visit (INDEPENDENT_AMBULATORY_CARE_PROVIDER_SITE_OTHER): Payer: 59 | Admitting: Medical

## 2019-02-16 ENCOUNTER — Other Ambulatory Visit: Payer: Self-pay

## 2019-02-16 ENCOUNTER — Encounter: Payer: Self-pay | Admitting: Medical

## 2019-02-16 VITALS — BP 115/69 | HR 59 | Temp 97.7°F | Resp 16 | Ht 71.0 in | Wt 222.2 lb

## 2019-02-16 DIAGNOSIS — I10 Essential (primary) hypertension: Secondary | ICD-10-CM | POA: Diagnosis not present

## 2019-02-16 DIAGNOSIS — K219 Gastro-esophageal reflux disease without esophagitis: Secondary | ICD-10-CM | POA: Diagnosis not present

## 2019-02-16 DIAGNOSIS — J302 Other seasonal allergic rhinitis: Secondary | ICD-10-CM

## 2019-02-16 DIAGNOSIS — R21 Rash and other nonspecific skin eruption: Secondary | ICD-10-CM

## 2019-02-16 MED ORDER — TRIAMCINOLONE ACETONIDE 0.025 % EX OINT: 1.0000 "application " | TOPICAL_OINTMENT | Freq: Two times a day (BID) | CUTANEOUS | 0 refills | Status: DC

## 2019-02-16 MED ORDER — LEVOCETIRIZINE DIHYDROCHLORIDE 5 MG PO TABS: 5.0000 mg | ORAL_TABLET | Freq: Every evening | ORAL | 3 refills | Status: DC

## 2019-02-16 MED ORDER — MUPIROCIN 2 % EX OINT: TOPICAL_OINTMENT | CUTANEOUS | 0 refills | Status: DC

## 2019-02-16 MED ORDER — AZELASTINE HCL 0.1 % NA SOLN: 2.0000 | Freq: Two times a day (BID) | NASAL | 12 refills | Status: DC

## 2019-02-16 MED ORDER — NYSTATIN 100000 UNIT/GM EX CREA: TOPICAL_CREAM | CUTANEOUS | 0 refills | Status: DC

## 2019-02-16 MED ORDER — FAMOTIDINE 20 MG PO TABS: 20.0000 mg | ORAL_TABLET | Freq: Every day | ORAL | 3 refills | Status: DC

## 2019-02-16 NOTE — Progress Notes (Signed)
Subjective:    Patient ID: Derek Love, male    DOB: 07-Aug-1970, 48 y.o.   MRN: [id number]  HPI  Pt in for first time.  Pt works in Press photographer, Pt not exercising regularly since onset of the pandemic. Nonsmoker. Drinks alcohol about every other month 5-6 beer. Pt admits diet moderate healthy. Avoids fried foods. Some excess carbs. Pt married- 2 children 30 yo and 91 yo.  Pt has hx of htn and controlled today. He is on benicar.  Pt has hx of gerd in past. He is in on prevacid. He states seems to be better than protonix. Pt states gerd since 48 years old. When he was 48 years old he had egd. Egd confirmed gastritis but no barretts reported that he knows of.  Pt states occasional intermittent ruq pain in past about 4-5 months ago.  08-26-2018. IMPRESSION: 1. No cholelithiasis or sonographic evidence of acute cholecystitis. 2. Increased hepatic echogenicity as can be seen with hepatic Steatosis   Pt also mentions year round allergies. He is on montelukast in past. He also uses flonase. He thinks get ha with flonase. Worse season spring.  Pt has some dry flaky area to rt thumb medial aspect. It happens to be on hand that he uses mouth.  On other hand does not have.   Chapped lips easily and he uses cortibalm chapstick.    Review of Systems  Constitutional: Negative for chills, fatigue and fever.  HENT: Negative for dental problem.   Respiratory: Negative for chest tightness, shortness of breath and wheezing.   Cardiovascular: Negative for chest pain and palpitations.  Gastrointestinal: Negative for abdominal distention, blood in stool, diarrhea, nausea, rectal pain and vomiting.  Musculoskeletal: Negative for back pain.  Skin: Negative for rash.  Neurological: Negative for dizziness and headaches.  Hematological: Negative for adenopathy. Does not bruise/bleed easily.  Psychiatric/Behavioral: Negative for behavioral problems, decreased concentration and dysphoric mood.    Past  Medical History:  Diagnosis Date  . ADHD (attention deficit hyperactivity disorder)   . Allergic rhinitis   . Asthma   . GERD (gastroesophageal reflux disease)   . Headache(784.0)   . Hypertension   . Positive PPD, treated      Social History   Socioeconomic History  . Marital status: Married    Spouse name: Not on file  . Number of children: Not on file  . Years of education: Not on file  . Highest education level: Not on file  Occupational History  . Not on file  Social Needs  . Financial resource strain: Not on file  . Food insecurity    Worry: Not on file    Inability: Not on file  . Transportation needs    Medical: Not on file    Non-medical: Not on file  Tobacco Use  . Smoking status: Never Smoker  . Smokeless tobacco: Never Used  Substance and Sexual Activity  . Alcohol use: Yes    Alcohol/week: 0.0 standard drinks    Comment: occ  . Drug use: No  . Sexual activity: Not on file  Lifestyle  . Physical activity    Days per week: Not on file    Minutes per session: Not on file  . Stress: Not on file  Relationships  . Social Herbalist on phone: Not on file    Gets together: Not on file    Attends religious service: Not on file    Active member of club or organization: Not  on file    Attends meetings of clubs or organizations: Not on file    Relationship status: Not on file  . Intimate partner violence    Fear of current or ex partner: Not on file    Emotionally abused: Not on file    Physically abused: Not on file    Forced sexual activity: Not on file  Other Topics Concern  . Not on file  Social History Narrative   Married   Occupation: Spanish- Recruitment consultantnglish translator          Past Surgical History:  Procedure Laterality Date  . No Surgical History      Family History  Problem Relation Age of Onset  . Coronary artery disease Other   . Diabetes Other   . Hyperlipidemia Other   . Hypertension Other   . Lung cancer Other   . Stroke  Other   . Stroke Father   . Hypertrophic cardiomyopathy Sister     No Known Allergies  Current Outpatient Medications on File Prior to Visit  Medication Sig Dispense Refill  . montelukast (SINGULAIR) 10 MG tablet Take 1 tablet (10 mg total) by mouth at bedtime. 90 tablet 3  . olmesartan (BENICAR) 40 MG tablet Take 1 tablet (40 mg total) by mouth daily. 90 tablet 3  . omeprazole (PRILOSEC) 20 MG capsule Take 20 mg by mouth daily.     No current facility-administered medications on file prior to visit.     BP 115/69   Pulse (!) 59   Temp 97.7 F (36.5 C) (Oral)   Resp 16   Ht 5\' 11"  (1.803 m)   Wt 222 lb 3.2 oz (100.8 kg)   SpO2 97%   BMI 30.99 kg/m       Objective:   Physical Exam   General Mental Status- Alert. General Appearance- Not in acute distress.     Neck Carotid Arteries- Normal color. Moisture- Normal Moisture. No carotid bruits. No JVD.  Chest and Lung Exam Auscultation: Breath Sounds:-Normal.  Cardiovascular Auscultation:Rythm- Regular. Murmurs & Other Heart Sounds:Auscultation of the heart reveals- No Murmurs.  Abdomen Inspection:-Inspeection Normal. Palpation/Percussion:Note:No mass. Palpation and Percussion of the abdomen reveal- Non Tender, Non Distended + BS, no rebound or guarding.  Neurologic Cranial Nerve exam:- CN III-XII intact(No nystagmus), symmetric smile. Strength:- 5/5 equal and symmetric strength both upper and lower extremities.  Skin- rt thumb medial aspect mild dry/flaky skin with faint abraised appeareance no dc. No rash presently under lower lip.     Assessment & Plan:  Good to meet you today.  Glad to see that your blood pressure is well controlled on Benicar.  For history of GERD not ideally controlled with Prevacid, I decided to prescribe famotidine and go ahead and refer you to GI for possible EGD.  For history of fatty liver by recent ultrasound, recommend minimize alcohol use, eat less fatty foods, control weight  and avoid fructose.   History of allergic rhinitis, continue with montelukast and prescribing Astelin to use in place of Flonase.  Also making Xyzal antihistamine available to use if needed.  You do appear to have small patch of eczema right hand.  Considering also that you might have allergic reaction to cleaning agent.  Would recommend that you had your computer mouse in the event that that is being cleaned.  Presently a little bit of skin breakdown and you report a little bit of dry yellow discharge at times so we will provide mupirocin on topical antibiotic use  twice daily.  Then you can also apply triamcinolone ointment thin film once daily midday.  For rash at the border of lip and chapped lips, would recommend you find lip balm with vitamin E.  You can occasionally use your over-the-counter lipoma hydrocortisone but recommend not overusing it.  Prescription of nystatin cream to apply thin film twice daily to lower lip when that area becomes inflamed.  If skin conditions worsen/do not improve can refer you to dermatologist.  Follow-up in 4 to 5 weeks or as needed.

## 2019-02-16 NOTE — Patient Instructions (Addendum)
Good to meet you today.  Glad to see that your blood pressure is well controlled on Benicar.  For history of GERD not ideally controlled with Prevacid, I decided to prescribe famotidine and go ahead and refer you to GI for possible EGD.  For history of fatty liver by recent ultrasound, recommend minimize alcohol use, eat less fatty foods, control weight and avoid fructose.   History of allergic rhinitis, continue with montelukast and prescribing Astelin to use in place of Flonase.  Also making Xyzal antihistamine available to use if needed.  You do appear to have small patch of eczema right hand.  Considering also that you might have allergic reaction to cleaning agent.  Would recommend that you had your computer mouse in the event that that is being cleaned.  Presently a little bit of skin breakdown and you report a little bit of dry yellow discharge at times so we will provide mupirocin on topical antibiotic use twice daily.  Then you can also apply triamcinolone ointment thin film once daily midday.  For rash at the border of lip and chapped lips, would recommend you find lip balm with vitamin E.  You can occasionally use your over-the-counter lipoma hydrocortisone but recommend not overusing it.  Prescription of nystatin cream to apply thin film twice daily to lower lip when that area becomes inflamed.  If skin conditions worsen/do not improve can refer you to dermatologist.  Follow-up in 4 to 5 weeks or as needed.

## 2019-02-23 ENCOUNTER — Encounter: Payer: Self-pay | Admitting: Gastroenterology

## 2019-03-23 ENCOUNTER — Ambulatory Visit: Payer: 59 | Admitting: Medical

## 2019-03-24 ENCOUNTER — Ambulatory Visit: Payer: 59 | Admitting: Gastroenterology

## 2019-05-03 ENCOUNTER — Ambulatory Visit: Payer: 59 | Admitting: Gastroenterology

## 2019-05-06 ENCOUNTER — Other Ambulatory Visit: Payer: Self-pay

## 2019-05-06 ENCOUNTER — Ambulatory Visit (INDEPENDENT_AMBULATORY_CARE_PROVIDER_SITE_OTHER): Payer: 59 | Admitting: Gastroenterology

## 2019-05-06 ENCOUNTER — Encounter: Payer: Self-pay | Admitting: Gastroenterology

## 2019-05-06 VITALS — BP 122/74 | HR 71 | Temp 97.8°F | Ht 72.0 in | Wt 225.2 lb

## 2019-05-06 DIAGNOSIS — K219 Gastro-esophageal reflux disease without esophagitis: Secondary | ICD-10-CM | POA: Diagnosis not present

## 2019-05-06 DIAGNOSIS — R1011 Right upper quadrant pain: Secondary | ICD-10-CM | POA: Diagnosis not present

## 2019-05-06 DIAGNOSIS — K760 Fatty (change of) liver, not elsewhere classified: Secondary | ICD-10-CM | POA: Diagnosis not present

## 2019-05-06 MED ORDER — OMEPRAZOLE 20 MG PO CPDR: 20.0000 mg | DELAYED_RELEASE_CAPSULE | Freq: Two times a day (BID) | ORAL | 3 refills | Status: DC

## 2019-05-06 NOTE — Patient Instructions (Signed)
If you are age 46 or older, your body mass index should be between 23-30. Your Body mass index is 30.54 kg/m. If this is out of the aforementioned range listed, please consider follow up with your Primary Care Provider.  If you are age 56 or younger, your body mass index should be between 19-25. Your Body mass index is 30.54 kg/m. If this is out of the aformentioned range listed, please consider follow up with your Primary Care Provider.   We have sent the following medications to your pharmacy for you to pick up at your convenience:  Omeprazole 20 mg  Twice daily  You have been scheduled for an endoscopy. Please follow the written instructions given to you at your visit today. Please pick up your prep supplies at the pharmacy within the next 1-3 days. If you use inhalers (even only as needed), please bring them with you on the day of your procedure.  Thank you for entrusting me with your care and for choosing [facility], Dr. [place] Cellar

## 2019-05-06 NOTE — Progress Notes (Signed)
HPI :  48 year old male with a history of GERD, hypertension, asthma, referred by Esperanza Richters PA for GERD and RUQ pain.  The patient states he has had symptoms of reflux for over 25 years.  His typical symptoms are pyrosis/water brash, regurgitation.  He denies any dysphagia at baseline.  He denies any vomiting but has occasional nausea.  He has been on a variety of antacids over the years to include Prilosec, Prevacid, Protonix, Zantac, famotidine.  He states he has typically rotated his regimen every few months as the effects can wane over time if he is on the same regimen.  He is currently on Prilosec 20 mg a day as well as Pepcid 20 mg a day.  He states this does prevent severe symptoms but he is having breakthrough a few times a day on the regimen and still has some symptoms of bothersome.  No weight loss, he thinks he may have gained 10 pounds in the past year or so given the Covid outbreak.  He has had a prior EGD in 2001, thinks he was told he had gastritis, done in [facility].  He denies any known history of Barrett's esophagus.  He otherwise endorses some right upper quadrant to epigastric discomfort, this is been going on for 5 years or so.  He states it is a very mild discomfort and describes it as a burning sensation at times, like someone's poking him.  His stomach can feel distended at times after eating.  He typically feels this discomfort after eating, but does not occur every time.  He had an ultrasound of his right upper quadrant in February which did not show any gallstones.  He did incidentally have fatty liver, he states he has been told this in the past.  He denies any personal or family history of liver disease.  He denies any significant alcohol use, drinks a few alcoholic beverages a month.  He denies any problems with blood in his stools.  He has regular bowel movements every day to twice a day.  His liver function testing has been normal.  No anemia.  [place]  08/26/18 - no gallstones, fatty liver  Past Medical History:  Diagnosis Date   ADHD (attention deficit hyperactivity disorder)    Allergic rhinitis    Asthma    GERD (gastroesophageal reflux disease)    Headache(784.0)    Hypertension    Positive PPD, treated      Past Surgical History:  Procedure Laterality Date   No Surgical History     UPPER GI ENDOSCOPY  2001   Family History  Problem Relation Age of Onset   Coronary artery disease Other    Diabetes Other    Hyperlipidemia Other    Hypertension Other    Lung cancer Other    Stroke Other    Stroke Father    Colon polyps Father    Hypertrophic cardiomyopathy Sister    Social History   Tobacco Use   Smoking status: Never Smoker   Smokeless tobacco: Never Used  Substance Use Topics   Alcohol use: Yes    Alcohol/week: 0.0 standard drinks    Comment: occ   Drug use: No   Current Outpatient Medications  Medication Sig Dispense Refill   famotidine (PEPCID) 20 MG tablet Take 1 tablet (20 mg total) by mouth daily. 30 tablet 3   fexofenadine (ALLEGRA) 180 MG tablet Take 180 mg by mouth daily.     montelukast (SINGULAIR) 10 MG  tablet Take 1 tablet (10 mg total) by mouth at bedtime. 90 tablet 3   olmesartan (BENICAR) 40 MG tablet Take 1 tablet (40 mg total) by mouth daily. 90 tablet 3   omeprazole (PRILOSEC) 20 MG capsule Take 20 mg by mouth daily.     triamcinolone (KENALOG) 0.025 % ointment Apply 1 application topically 2 (two) times daily. 30 g 0   No current facility-administered medications for this visit.    No Known Allergies   Review of Systems: All systems reviewed and negative except where noted in HPI.   Lab Results  Component Value Date   WBC 6.1 08/24/2018   HGB 15.6 08/24/2018   HCT 44.6 08/24/2018   MCV 89 08/24/2018   PLT 199 08/24/2018    Lab Results  Component Value Date   CREATININE 1.05 08/24/2018   BUN 11 08/24/2018   NA 141 08/24/2018   K 4.7 08/24/2018     CL 100 08/24/2018   CO2 25 08/24/2018    Lab Results  Component Value Date   ALT 28 08/24/2018   AST 20 08/24/2018   ALKPHOS 76 08/24/2018   BILITOT 0.5 08/24/2018     Physical Exam: BP 122/74    Pulse 71    Temp 97.8 F (36.6 C)    Ht 6' (1.829 m)    Wt 225 lb 3.2 oz (102.2 kg)    BMI 30.54 kg/m  Constitutional: Pleasant,well-developed, male in no acute distress. HEENT: Normocephalic and atraumatic. Conjunctivae are normal. No scleral icterus. Neck supple.  Cardiovascular: Normal rate, regular rhythm.  Pulmonary/chest: Effort normal and breath sounds normal. No wheezing, rales or rhonchi. Abdominal: Soft, nondistended, nontender. There are no masses palpable. No hepatomegaly. Extremities: no edema Lymphadenopathy: No cervical adenopathy noted. Neurological: Alert and oriented to person place and time. Skin: Skin is warm and dry. No rashes noted. Psychiatric: Normal mood and affect. Behavior is normal.   ASSESSMENT AND PLAN: 48 year old male here for new patient consultation regarding the following:  GERD - longstanding symptoms of reflux that do respond to antacid therapy, however he needs to vary his antacid regimen every few months to maintain fair control of symptoms.  He has fairly frequent breakthrough on a typical day, no alarm symptoms.  He has gained some weight in the past year which may be associated with worsening and he will work on weight loss.  I otherwise discussed options with him.  I discussed long-term risks of chronic PPI use in detail.  Long-term we want to use the lowest dose to control symptoms however, at this point time I think he needs a dose escalation to provide better control symptoms and quality of life.  He will increase omeprazole to 20 mg twice a day at this time and see how he does.  Otherwise we discussed other alternative regimens for reflux to include surgery and endoscopic therapy (TIF).  I think he may be a good candidate for TIF after  discussing this with him, hopefully this would allow him to come off all PPIs.  I am recommending an EGD to further evaluate his candidacy for TIF, and screen for Barrett's esophagus, rule out large hiatal hernia, etc.  Following discussion of risk benefits of endoscopy he wanted to proceed.  He will continue omeprazole twice daily in the interim to see if this helps some, and if he is a candidate for TIF pending EGD findings, I will refer him to Dr. Bryan Lemma for evaluation.  He agreed  Right upper quadrant  pain - unclear etiology, description could be consistent with neuropathic, however EGD will evaluate, exclude H. pylori, etc.  If persistent over time can consider HIDA however a bit atypical for gallbladder based on description given today.  Ultrasound without evidence of gallstones or now.  He agreed  Fatty liver - counseled him on this and the spectrum of fatty liver disease, long-term risk for cirrhosis.  His liver enzymes are normal which is good.  He does not drink much alcohol.  Recommend he have his liver enzymes checked every year and he should work on weight loss to help prevent worsening.  Ileene PatrickSteven Rad Gramling, MD [facility]  CC: Esperanza RichtersSaguier, Edward, [place] JerseyPA-C

## 2019-05-11 ENCOUNTER — Encounter: Payer: 59 | Admitting: Gastroenterology

## 2019-05-11 ENCOUNTER — Ambulatory Visit (INDEPENDENT_AMBULATORY_CARE_PROVIDER_SITE_OTHER): Payer: 59

## 2019-05-11 DIAGNOSIS — Z1159 Encounter for screening for other viral diseases: Secondary | ICD-10-CM

## 2019-05-12 LAB — SARS CORONAVIRUS 2 (TAT 6-24 HRS): SARS Coronavirus 2: NEGATIVE

## 2019-05-13 ENCOUNTER — Ambulatory Visit (AMBULATORY_SURGERY_CENTER): Payer: 59 | Admitting: Gastroenterology

## 2019-05-13 ENCOUNTER — Encounter: Payer: Self-pay | Admitting: Gastroenterology

## 2019-05-13 ENCOUNTER — Other Ambulatory Visit: Payer: Self-pay

## 2019-05-13 VITALS — BP 126/74 | HR 80 | Temp 98.3°F | Resp 16 | Ht 73.0 in | Wt 225.0 lb

## 2019-05-13 DIAGNOSIS — K219 Gastro-esophageal reflux disease without esophagitis: Secondary | ICD-10-CM

## 2019-05-13 DIAGNOSIS — K2970 Gastritis, unspecified, without bleeding: Secondary | ICD-10-CM | POA: Diagnosis not present

## 2019-05-13 DIAGNOSIS — K2951 Unspecified chronic gastritis with bleeding: Secondary | ICD-10-CM

## 2019-05-13 MED ORDER — SODIUM CHLORIDE 0.9 % IV SOLN: 500.0000 mL | Freq: Once | INTRAVENOUS | Status: DC

## 2019-05-13 NOTE — Progress Notes (Signed)
Called to room to assist during endoscopic procedure.  Patient ID and intended procedure confirmed with present staff. Received instructions for my participation in the procedure from the performing physician.  

## 2019-05-13 NOTE — Patient Instructions (Signed)
HANDOUTS PROVIDED ON: GERD & GASTRITIS  THE BIOPSIES TAKEN TODAY HAVE BEEN SENT FOR PATHOLOGY.  THE RESULTS CAN TAKE 2-3 WEEKS TO RECEIVE.    YOU MAY RESUME YOUR PREVIOUS DIET AND MEDICATION SCHEDULE.  Gary YOU FOR ALLOWING [place] TO CARE FOR YOU TODAY!!!  YOU HAD AN ENDOSCOPIC PROCEDURE TODAY AT [facility]:   Refer to the procedure report that was given to you for any specific questions about what was found during the examination.  If the procedure report does not answer your questions, please call your gastroenterologist to clarify.  If you requested that your care partner not be given the details of your procedure findings, then the procedure report has been included in a sealed envelope for you to review at your convenience later.  YOU SHOULD EXPECT: Some feelings of bloating in the abdomen. Passage of more gas than usual.  Walking can help get rid of the air that was put into your GI tract during the procedure and reduce the bloating. If you had a lower endoscopy (such as a colonoscopy or flexible sigmoidoscopy) you may notice spotting of blood in your stool or on the toilet paper. If you underwent a bowel prep for your procedure, you may not have a normal bowel movement for a few days.  Please Note:  You might notice some irritation and congestion in your nose or some drainage.  This is from the oxygen used during your procedure.  There is no need for concern and it should clear up in a day or so.  SYMPTOMS TO REPORT IMMEDIATELY:   Following upper endoscopy (EGD)  Vomiting of blood or coffee ground material  New chest pain or pain under the shoulder blades  Painful or persistently difficult swallowing  New shortness of breath  Fever of 100F or higher  Black, tarry-looking stools  For urgent or emergent issues, a gastroenterologist can be reached at any hour by calling [phone].   DIET:  We do recommend a small meal at first, but then you may proceed to your  regular diet.  Drink plenty of fluids but you should avoid alcoholic beverages for 24 hours.  ACTIVITY:  You should plan to take it easy for the rest of today and you should NOT DRIVE or use heavy machinery until tomorrow (because of the sedation medicines used during the test).    FOLLOW UP: Our staff will call the number listed on your records 48-72 hours following your procedure to check on you and address any questions or concerns that you may have regarding the information given to you following your procedure. If we do not reach you, we will leave a message.  We will attempt to reach you two times.  During this call, we will ask if you have developed any symptoms of COVID 19. If you develop any symptoms (ie: fever, flu-like symptoms, shortness of breath, cough etc.) before then, please call [phone].  If you test positive for Covid 19 in the 2 weeks post procedure, please call and report this information to [place].    If any biopsies were taken you will be contacted by phone or by letter within the next 1-3 weeks.  Please call us at [phone] if you have not heard about the biopsies in 3 weeks.    SIGNATURES/CONFIDENTIALITY: You and/or your care partner have signed paperwork which will be entered into your electronic medical record.  These signatures attest to the fact that that the information above on  your After Visit Summary has been reviewed and is understood.  Full responsibility of the confidentiality of this discharge information lies with you and/or your care-partner. 

## 2019-05-13 NOTE — Progress Notes (Signed)
Report given to PACU, vss 

## 2019-05-13 NOTE — Op Note (Signed)
[facility] Patient Name: Derek Love Procedure Date: 05/13/2019 8:16 AM MRN: [id number] Endoscopist: Viviann SpareSteven P. Adela LankArmbruster , MD Age: [id number] Referring MD:  Date of Birth: 1970-10-14 Gender: Male Account #: [id number] Procedure:                Upper GI endoscopy Indications:              gastro-esophageal reflux disease, responsive to                            PPIs but needs to cycle regimen to maintain                            response, rule out Barrett's, assess candidacy for                            TIF as patient wishes to come off PPI. Currently on                            omeprazole 20mg  twice daily Medicines:                Monitored Anesthesia Care Procedure:                Pre-Anesthesia Assessment:                           - Prior to the procedure, a History and Physical                            was performed, and patient medications and                            allergies were reviewed. The patient's tolerance of                            previous anesthesia was also reviewed. The risks                            and benefits of the procedure and the sedation                            options and risks were discussed with the patient.                            All questions were answered, and informed consent                            was obtained. Prior Anticoagulants: The patient has                            taken no previous anticoagulant or antiplatelet                            agents. ASA Grade Assessment: II - A patient with  mild systemic disease. After reviewing the risks                            and benefits, the patient was deemed in                            satisfactory condition to undergo the procedure.                           After obtaining informed consent, the endoscope was                            passed under direct vision. Throughout the                            procedure, the patient's  blood pressure, pulse, and                            oxygen saturations were monitored continuously. The                            Endoscope was introduced through the mouth, and                            advanced to the second part of duodenum. The upper                            GI endoscopy was accomplished without difficulty.                            The patient tolerated the procedure well. Scope In: Scope Out: Findings:                 Esophagogastric landmarks were identified: the                            Z-line was found at 40 cm, the gastroesophageal                            junction was found at 40 cm and the upper extent of                            the gastric folds was found at 40 cm from the                            incisors.                           The gastroesophageal flap valve was visualized                            endoscopically and classified as Hill Grade I                            (  prominent fold, tight to endoscope).                           The exam of the esophagus was otherwise normal. No                            Barrett's esophagus or esophagitis                           Diffuse inflammation was found in the gastric                            fundus and in the gastric body and proximal antrum.                            The mucosa had a "cobblestone" appearance to it,                            mottled with erythema. No focal ulceration.                            Biopsies were taken with a cold forceps for                            histology from the antrum, body, incisura, and                            fundus.                           The exam of the stomach was otherwise normal.                           The duodenal bulb and second portion of the                            duodenum were normal. Complications:            No immediate complications. Estimated blood loss:                            Minimal. Estimated Blood Loss:      Estimated blood loss was minimal. Impression:               - Esophagogastric landmarks identified.                           - Gastroesophageal flap valve classified as Hill                            Grade I (prominent fold, tight to endoscope).                           - Normal esophagus otherwise, no evidence of  Barrett's or esophagitis.                           - Diffuse gastritis as above. Biopsied.                           - Normal duodenal bulb and second portion of the                            duodenum. Recommendation:           - Patient has a contact number available for                            emergencies. The signs and symptoms of potential                            delayed complications were discussed with the                            patient. Return to normal activities tomorrow.                            Written discharge instructions were provided to the                            patient.                           - Resume previous diet.                           - Continue present medications.                           - Await pathology results.                           - Patient is a candidate for endoscopic reflux                            therapy (TIF). Will proceed with barium swallow if                            the patient is interested to objectively document                            reflux and refer to Dr. Bryan Lemma for TIF                            evaluation if patient remains interested in                            alternative therapy Remo Lipps P. Armbruster, MD 05/13/2019 8:38:05 AM This report has been signed electronically.

## 2019-05-14 ENCOUNTER — Telehealth: Payer: Self-pay

## 2019-05-14 ENCOUNTER — Other Ambulatory Visit: Payer: Self-pay

## 2019-05-14 DIAGNOSIS — K219 Gastro-esophageal reflux disease without esophagitis: Secondary | ICD-10-CM

## 2019-05-14 NOTE — Telephone Encounter (Signed)
-----   Message from Yetta Flock, MD sent at 05/13/2019  4:39 PM EST ----- Regarding: barium swallow Sherlynn Stalls can you please contact this patient to coordinate barium swallow for GERD, pre-op for TIF. Thanks

## 2019-05-14 NOTE — Telephone Encounter (Signed)
Patient called back, scheduled for Barium Swallow at [facility] on 05/18/19. To arrive at 8:15am and be NPO 3 hours before

## 2019-05-17 ENCOUNTER — Telehealth: Payer: Self-pay | Admitting: *Deleted

## 2019-05-17 ENCOUNTER — Telehealth: Payer: Self-pay

## 2019-05-17 NOTE — Telephone Encounter (Signed)
  Follow up Call-  Call back number 05/13/2019  Post procedure Call Back phone  # [phone]  Permission to leave phone message Yes  Some recent data might be hidden     Patient questions:  Do you have a fever, pain , or abdominal swelling? No. Pain Score  0 *  Have you tolerated food without any problems? Yes.    Have you been able to return to your normal activities? Yes.    Do you have any questions about your discharge instructions: Diet   No. Medications  No. Follow up visit  No.  Do you have questions or concerns about your Care? No.  Actions: * If pain score is 4 or above: No action needed, pain <4.  1. Have you developed a fever since your procedure? no  2.   Have you had an respiratory symptoms (SOB or cough) since your procedure? no  3.   Have you tested positive for COVID 19 since your procedure no  4.   Have you had any family members/close contacts diagnosed with the COVID 19 since your procedure?  no   If yes to any of these questions please route to Joylene John, RN and Alphonsa Gin, Therapist, sports.

## 2019-05-17 NOTE — Telephone Encounter (Signed)
No answer for post procedure call back. Left message and will call back later this afternoon. SM 

## 2019-05-18 ENCOUNTER — Other Ambulatory Visit: Payer: Self-pay

## 2019-05-18 ENCOUNTER — Ambulatory Visit (HOSPITAL_COMMUNITY)
Admission: RE | Admit: 2019-05-18 | Discharge: 2019-05-18 | Disposition: A | Discharge status: Home / Self Care | Payer: 59 | Source: Ambulatory Visit | Attending: Gastroenterology | Admitting: Gastroenterology

## 2019-05-18 DIAGNOSIS — K219 Gastro-esophageal reflux disease without esophagitis: Secondary | ICD-10-CM | POA: Insufficient documentation

## 2019-05-20 ENCOUNTER — Ambulatory Visit (HOSPITAL_COMMUNITY): Admission: RE | Admit: 2019-05-20 | Payer: 59 | Source: Home / Self Care | Admitting: Gastroenterology

## 2019-05-20 ENCOUNTER — Other Ambulatory Visit: Payer: Self-pay

## 2019-05-20 ENCOUNTER — Encounter (HOSPITAL_COMMUNITY): Admission: RE | Payer: Self-pay | Source: Home / Self Care

## 2019-05-20 DIAGNOSIS — R1011 Right upper quadrant pain: Secondary | ICD-10-CM

## 2019-05-20 SURGERY — MONITORING, ESOPHAGEAL PH, 24 HOUR
Anesthesia: LOCAL

## 2019-05-26 ENCOUNTER — Encounter: Payer: Self-pay | Admitting: Medical

## 2019-05-26 ENCOUNTER — Telehealth: Payer: Self-pay | Admitting: Gastroenterology

## 2019-05-26 NOTE — Telephone Encounter (Signed)
Pt is scheduled for a Hida scan on 12/3 at [facility]. He called to inform that his son has Covid-19 and pt is also having sxs. He just had Covid test at [facility] and will call us back when he gets his results to confirm.

## 2019-05-27 ENCOUNTER — Encounter: Payer: Self-pay | Admitting: Medical

## 2019-05-29 ENCOUNTER — Ambulatory Visit (INDEPENDENT_AMBULATORY_CARE_PROVIDER_SITE_OTHER): Payer: 59 | Admitting: Family Medicine

## 2019-05-29 ENCOUNTER — Encounter: Payer: Self-pay | Admitting: Family Medicine

## 2019-05-29 VITALS — Temp 97.5°F

## 2019-05-29 DIAGNOSIS — U071 COVID-19: Secondary | ICD-10-CM

## 2019-05-29 DIAGNOSIS — J4541 Moderate persistent asthma with (acute) exacerbation: Secondary | ICD-10-CM

## 2019-05-29 MED ORDER — PREDNISONE 50 MG PO TABS: 50.0000 mg | ORAL_TABLET | Freq: Every day | ORAL | 0 refills | Status: DC

## 2019-05-29 NOTE — Progress Notes (Signed)
I connected with Derek Love on 05/29/19 at 11:00 AM EST by video and verified that I am speaking with the correct person using two identifiers.   I discussed the limitations, risks, security and privacy concerns of performing an evaluation and management service by video and the availability of in person appointments. I also discussed with the patient that there may be a patient responsible charge related to this service. The patient expressed understanding and agreed to proceed.  Patient location: Home Provider Location: [place] [facility] Participants: Lesleigh Noe and Derek Love   Subjective:     Derek Love is a 48 y.o. male presenting for Cough     Cough This is a new problem. The current episode started 1 to 4 weeks ago. The problem has been unchanged. The cough is productive of sputum. Associated symptoms include a fever, headaches, nasal congestion, postnasal drip and wheezing. Pertinent negatives include no ear pain or sore throat.   Tested for Covid on 11/22 and positive for covid  Asthma - not very bad. Typically only flares up with allergies. Used albuterol overnight with cough w/ improvement  Has singular and allegra which he takes daily  Does not take a daily inhaler  Review of Systems  Constitutional: Positive for fever.  HENT: Positive for postnasal drip. Negative for congestion, ear pain, sinus pressure, sinus pain, sneezing and sore throat.   Respiratory: Positive for cough, chest tightness and wheezing.   Neurological: Positive for headaches.     Social History   Tobacco Use  Smoking Status Never Smoker  Smokeless Tobacco Never Used        Objective:   BP Readings from Last 3 Encounters:  05/13/19 126/74  05/06/19 122/74  02/16/19 115/69   Wt Readings from Last 3 Encounters:  05/13/19 225 lb (102.1 kg)  05/06/19 225 lb 3.2 oz (102.2 kg)  02/16/19 222 lb 3.2 oz (100.8 kg)    Temp (!) 97.5 F (36.4 C)    Physical  Exam Constitutional:      Appearance: Normal appearance. He is not ill-appearing.  HENT:     Head: Normocephalic and atraumatic.     Right Ear: External ear normal.     Left Ear: External ear normal.  Eyes:     Conjunctiva/sclera: Conjunctivae normal.  Pulmonary:     Effort: Pulmonary effort is normal. No respiratory distress.  Neurological:     Mental Status: He is alert. Mental status is at baseline.  Psychiatric:        Mood and Affect: Mood normal.        Behavior: Behavior normal.        Thought Content: Thought content normal.        Judgment: Judgment normal.            Assessment & Plan:   Problem List Items Addressed This Visit    None    Visit Diagnoses    Moderate persistent asthma with exacerbation    -  Primary   Relevant Medications   predniSONE (DELTASONE) 50 MG tablet   COVID-19 virus infection         Pt positive for Covid on day 9 of illness Discussed urgent care/ER precautions  Given hx of asthma and chest tightness - advised albuterol q4-6 hours x 2 days   If minimal improvement - can try steroid. But emphasized importance of going to urgent care if worsening.   Continue isolation until symptoms are improving. Approximately 10 days since  symptoms onste  Return if symptoms worsen or fail to improve.  Lynnda Child, MD

## 2019-05-31 ENCOUNTER — Telehealth: Payer: Self-pay

## 2019-05-31 NOTE — Telephone Encounter (Signed)
Patient states he tested positive for COVID on 05/26/19 so he needs to cancel his HIDA for 06/03/19. He agreed to call back when he is totally recovered and reschedule

## 2019-05-31 NOTE — Telephone Encounter (Signed)
Okay thanks for letting me know, sorry to hear this.  

## 2019-06-02 ENCOUNTER — Other Ambulatory Visit: Payer: Self-pay | Admitting: Family Medicine

## 2019-06-02 NOTE — Telephone Encounter (Signed)
Last OV 05/29/19 Last refill 06/02/18 #90/3 - Dr. Sarajane Jews Next OV not scheduled

## 2019-06-03 ENCOUNTER — Ambulatory Visit (HOSPITAL_COMMUNITY): Payer: 59

## 2019-06-03 NOTE — Telephone Encounter (Signed)
Refilled olmasartan today.

## 2019-06-29 ENCOUNTER — Ambulatory Visit: Payer: 59 | Admitting: Medical

## 2019-07-21 ENCOUNTER — Telehealth: Payer: Self-pay | Admitting: Medical

## 2019-07-21 ENCOUNTER — Encounter: Payer: Self-pay | Admitting: Medical

## 2019-07-21 NOTE — Telephone Encounter (Signed)
Pt has been having chest pain. He sent me my chart message. I asked him to make office visit. Is our ekg machine working? Will you call and help get him scheduled.

## 2019-07-22 NOTE — Telephone Encounter (Signed)
Spoke to patient and he stated he is not having any pain today. He stated that he was feeling the pain traveling up his back on his left side. Patient stated he is feeling light headed when it happened.  Patient is scheduled for visit.

## 2019-07-22 NOTE — Telephone Encounter (Signed)
Tried to call patient regarding making an appt. Left patient a voicemail in regards to calling office to make an appt or if severe pain please follow up with ED.

## 2019-07-23 ENCOUNTER — Ambulatory Visit: Payer: 59 | Admitting: Medical

## 2019-07-23 ENCOUNTER — Other Ambulatory Visit: Payer: Self-pay

## 2019-07-23 ENCOUNTER — Ambulatory Visit (HOSPITAL_BASED_OUTPATIENT_CLINIC_OR_DEPARTMENT_OTHER)
Admission: RE | Admit: 2019-07-23 | Discharge: 2019-07-23 | Disposition: A | Discharge status: Home / Self Care | Payer: 59 | Source: Ambulatory Visit | Attending: Medical | Admitting: Medical

## 2019-07-23 ENCOUNTER — Encounter: Payer: Self-pay | Admitting: Medical

## 2019-07-23 VITALS — BP 128/80 | HR 78 | Temp 97.9°F | Resp 16 | Ht 72.0 in | Wt 220.2 lb

## 2019-07-23 DIAGNOSIS — R0789 Other chest pain: Secondary | ICD-10-CM | POA: Diagnosis present

## 2019-07-23 DIAGNOSIS — R002 Palpitations: Secondary | ICD-10-CM | POA: Diagnosis not present

## 2019-07-23 DIAGNOSIS — R600 Localized edema: Secondary | ICD-10-CM | POA: Diagnosis not present

## 2019-07-23 DIAGNOSIS — K219 Gastro-esophageal reflux disease without esophagitis: Secondary | ICD-10-CM

## 2019-07-23 LAB — TROPONIN I (HIGH SENSITIVITY): High Sens Troponin I: 2 ng/L (ref 2–17)

## 2019-07-23 NOTE — Progress Notes (Signed)
Subjective:    Patient ID: Derek Love, male    DOB: [birth year], 49 y.o.   MRN: [id number]  HPI  Pt in for some recent pain that are occurring intermittently. He states pain is transient. Pain on left side and some in back. Pain when it occurs is low and transient. Pain comes and goes and intermittent. Pain gain be present all day at times but some last for 15 minutes. Last time he had pain was 2 days.   2 days ago when had pain last for some hours. Pt one time last week felt on treadmill. But state sometimes pain at rest. Sometimes pain might go toward shoulder but non recently.  Pt states mild pain that has been on and off for 2 years but not as intense.   Pt states also at time feels palpitations. Palpitations vary in frequency. Average maybe 2-3 days a week. Last time had was 2 days ago.  Pt has history of mild elevated cholesterol. Hx of htn but bp controlled.  Pt states uncle CAD at age 35 yo. Died duriing bypass surgery. Dad has CAD and bypass and carotid surgery. Other uncle CAD as well. Pt sister has some heart issue but unclear per pt.  Pt does have hx of heart burn. Pt is on prilosec. Uses it once a day.    Review of Systems  Constitutional: Negative for chills, fatigue and fever.  Respiratory: Negative for cough, chest tightness, shortness of breath and wheezing.   Cardiovascular: Positive for chest pain and palpitations.  Gastrointestinal: Negative for abdominal pain, constipation, diarrhea and rectal pain.       See  Hpi. Occasional reflux type pain.  Genitourinary: Negative for dysuria and frequency.  Musculoskeletal: Negative for back pain.  Skin: Negative for rash.  Neurological: Negative for dizziness, speech difficulty, weakness and headaches.  Hematological: Negative for adenopathy. Does not bruise/bleed easily.  Psychiatric/Behavioral: Negative for behavioral problems and confusion.    Past Medical History:  Diagnosis Date  . ADHD (attention deficit  hyperactivity disorder)   . Allergic rhinitis   . Asthma   . GERD (gastroesophageal reflux disease)   . Headache(784.0)   . Hypertension   . Positive PPD, treated      Social History   Socioeconomic History  . Marital status: Married    Spouse name: Not on file  . Number of children: Not on file  . Years of education: Not on file  . Highest education level: Not on file  Occupational History  . Not on file  Tobacco Use  . Smoking status: Never Smoker  . Smokeless tobacco: Never Used  Substance and Sexual Activity  . Alcohol use: Yes    Alcohol/week: 0.0 standard drinks    Comment: occ  . Drug use: No  . Sexual activity: Yes  Other Topics Concern  . Not on file  Social History Narrative   Married   Occupation: Spanish- Recruitment consultant         Social Determinants of Corporate investment banker Strain:   . Difficulty of Paying Living Expenses: Not on file  Food Insecurity:   . Worried About Programme researcher, broadcasting/film/video in the Last Year: Not on file  . Ran Out of Food in the Last Year: Not on file  Transportation Needs:   . Lack of Transportation (Medical): Not on file  . Lack of Transportation (Non-Medical): Not on file  Physical Activity:   . Days of Exercise per Week: Not on  file  . Minutes of Exercise per Session: Not on file  Stress:   . Feeling of Stress : Not on file  Social Connections:   . Frequency of Communication with Friends and Family: Not on file  . Frequency of Social Gatherings with Friends and Family: Not on file  . Attends Religious Services: Not on file  . Active Member of Clubs or Organizations: Not on file  . Attends Banker Meetings: Not on file  . Marital Status: Not on file  Intimate Partner Violence:   . Fear of Current or Ex-Partner: Not on file  . Emotionally Abused: Not on file  . Physically Abused: Not on file  . Sexually Abused: Not on file    Past Surgical History:  Procedure Laterality Date  . No Surgical History      . UPPER GI ENDOSCOPY  2001    Family History  Problem Relation Age of Onset  . Coronary artery disease Other   . Diabetes Other   . Hyperlipidemia Other   . Hypertension Other   . Lung cancer Other   . Stroke Other   . Stroke Father   . Colon polyps Father   . Hypertrophic cardiomyopathy Sister     No Known Allergies  Current Outpatient Medications on File Prior to Visit  Medication Sig Dispense Refill  . famotidine (PEPCID) 20 MG tablet Take 1 tablet (20 mg total) by mouth daily. 30 tablet 3  . fexofenadine (ALLEGRA) 180 MG tablet Take 180 mg by mouth daily.    . montelukast (SINGULAIR) 10 MG tablet Take 1 tablet (10 mg total) by mouth at bedtime. 90 tablet 3  . olmesartan (BENICAR) 40 MG tablet TAKE ONE TABLET BY MOUTH ONE TIME DAILY  90 tablet 0  . omeprazole (PRILOSEC) 20 MG capsule Take 1 capsule (20 mg total) by mouth 2 (two) times daily. 60 capsule 3   No current facility-administered medications on file prior to visit.    BP 128/80 (BP Location: Left Arm, Patient Position: Sitting, Cuff Size: Normal)   Pulse 78   Temp 97.9 F (36.6 C) (Temporal)   Resp 16   Ht 6' (1.829 m)   Wt 220 lb 3.2 oz (99.9 kg)   SpO2 97%   BMI 29.86 kg/m       Objective:   Physical Exam  General Mental Status- Alert. General Appearance- Not in acute distress.   Skin General: Color- Normal Color. Moisture- Normal Moisture.  Neck Carotid Arteries- Normal color. Moisture- Normal Moisture. No carotid bruits. No JVD.  Chest and Lung Exam Auscultation: Breath Sounds:-Normal.  Cardiovascular Auscultation:Rythm- Regular. Murmurs & Other Heart Sounds:Auscultation of the heart reveals- No Murmurs.  Abdomen Inspection:-Inspeection Normal. Palpation/Percussion:Note:No mass. Palpation and Percussion of the abdomen reveal- Non Tender, Non Distended + BS, no rebound or guarding.   Neurologic Cranial Nerve exam:- CN III-XII intact(No nystagmus), symmetric smile. Strength:- 5/5  equal and symmetric strength both upper and lower extremities.   Anterior chest- no pain on palpation.  Lower ext- no pedal edema presently.    Assessment & Plan:  You do have some recent atypical chest pain.  No pain for 2 days.  Your EKG showed sinus rhythm with mild bradycardia.  No ischemia type findings.  You do have some cardiovascular risk factors.  So I do think is best to go ahead and refer you to cardiologist for further evaluation.  For caution sake go ahead and get 1 troponin stat.  No signs/symptoms for 2  days.  But sometimes might capture downtrending troponin.  If so that would be important to know.  He has any recurrent severe cardiac type signs symptoms pending cardiologist evaluation and recommend ED evaluation.  Regarding your intermittent palpitations, recommend to stop all caffeinated beverages.  For history of GERD, I do think is a good idea for you to go ahead and take the omeprazole twice daily.  This way GERD control will be maximized and avoid any confusion as sometimes heartburn symptoms can be confused with cardiac symptoms.  He mentions some intermittent rare pedal edema.  Mention one time with alcohol use.  Recommend not drinking any alcohol presently.  Follow-up in 2 to 3 weeks or as needed.  Mackie Pai, PA-C

## 2019-07-23 NOTE — Patient Instructions (Signed)
You do have some recent atypical chest pain.  No pain for 2 days.  Your EKG showed sinus rhythm with mild bradycardia.  No ischemia type findings.  You do have some cardiovascular risk factors.  So I do think is best to go ahead and refer you to cardiologist for further evaluation.  For caution sake go ahead and get 1 troponin stat.  No signs/symptoms for 2 days.  But sometimes might capture downtrending troponin.  If so that would be important to know.  He has any recurrent severe cardiac type signs symptoms pending cardiologist evaluation and recommend ED evaluation.  Regarding your intermittent palpitations, recommend to stop all caffeinated beverages.  For history of GERD, I do think is a good idea for you to go ahead and take the omeprazole twice daily.  This way GERD control will be maximized and avoid any confusion as sometimes heartburn symptoms can be confused with cardiac symptoms.  He mentions some intermittent rare pedal edema.  Mention one time with alcohol use.  Recommend not drinking any alcohol presently.  Follow-up in 2 to 3 weeks or as needed.

## 2019-07-28 ENCOUNTER — Ambulatory Visit (INDEPENDENT_AMBULATORY_CARE_PROVIDER_SITE_OTHER): Payer: 59 | Admitting: Cardiology

## 2019-07-28 ENCOUNTER — Other Ambulatory Visit: Payer: Self-pay

## 2019-07-28 ENCOUNTER — Encounter: Payer: Self-pay | Admitting: Cardiology

## 2019-07-28 VITALS — BP 112/72 | HR 72 | Ht 72.0 in | Wt 220.0 lb

## 2019-07-28 DIAGNOSIS — R002 Palpitations: Secondary | ICD-10-CM

## 2019-07-28 DIAGNOSIS — I10 Essential (primary) hypertension: Secondary | ICD-10-CM | POA: Diagnosis not present

## 2019-07-28 DIAGNOSIS — R0789 Other chest pain: Secondary | ICD-10-CM

## 2019-07-28 MED ORDER — ASPIRIN EC 81 MG PO TBEC: 81.0000 mg | DELAYED_RELEASE_TABLET | Freq: Every day | ORAL | 3 refills | Status: DC

## 2019-07-28 NOTE — Addendum Note (Signed)
Addended by: Lita Mains on: 07/28/2019 09:11 AM   Modules accepted: Orders

## 2019-07-28 NOTE — Patient Instructions (Addendum)
Medication Instructions:  Your physician has recommended you make the following change in your medication:  START: Aspirin 81 mg daily   *If you need a refill on your cardiac medications before your next appointment, please call your pharmacy*  Lab Work: None.  If you have labs (blood work) drawn today and your tests are completely normal, you will receive your results only by: Marland Kitchen MyChart Message (if you have MyChart) OR . A paper copy in the mail If you have any lab test that is abnormal or we need to change your treatment, we will call you to review the results.  Testing/Procedures: Your physician has requested that you have an exercise tolerance test. For further information please visit [email or web address] Please also follow instruction sheet, as given.  Your physician has recommended that you wear a holter monitor. Holter monitors are medical devices that record the heart's electrical activity. Doctors most often use these monitors to diagnose arrhythmias. Arrhythmias are problems with the speed or rhythm of the heartbeat. The monitor is a small, portable device. You can wear one while you do your normal daily activities. This is usually used to diagnose what is causing palpitations/syncope (passing out). Wear for 7 days.     Follow-Up: At [facility], you and your health needs are our priority.  As part of our continuing mission to provide you with exceptional heart care, we have created designated Provider Care Teams.  These Care Teams include your primary Cardiologist (physician) and Advanced Practice Providers (APPs -  Physician Assistants and Nurse Practitioners) who all work together to provide you with the care you need, when you need it.  Your next appointment:   1 month(s)  The format for your next appointment:   In Person  Provider:   Gypsy Balsam, MD  Other Instructions   Aspirin and Your Heart  Aspirin is a medicine that prevents the cells in the blood  that are used for clotting, called platelets, from sticking together. Aspirin can be used to help reduce the risk of blood clots, heart attacks, and other heart-related problems. Can I take aspirin? Your health care provider will help you determine whether it is safe and beneficial for you to take aspirin daily. Taking aspirin daily may be helpful if you:  Have had a heart attack or chest pain.  Are at risk for a heart attack.  Have undergone open-heart surgery, such as coronary artery bypass surgery (CABG).  Have had coronary angioplasty or a stent.  Have had certain types of stroke or transient ischemic attack (TIA).  Have peripheral artery disease (PAD).  Have chronic heart rhythm problems such as atrial fibrillation and cannot take an anticoagulant.  Have valve disease or have had surgery on a valve. What are the risks? Daily use of aspirin can cause side effects. Some of these include:  Bleeding. Bleeding problems can be minor or serious. An example of a minor problem is a cut that does not stop bleeding. An example of a more serious problem is stomach bleeding or, rarely, bleeding into the brain. Your risk of bleeding is increased if you are also taking non-steroidal anti-inflammatory drugs (NSAIDs).  Increased bruising.  Upset stomach.  An allergic reaction. People who have nasal polyps have an increased risk of developing an aspirin allergy. General guidelines  Take aspirin only as told by your health care provider. Make sure that you understand how much you should take and what form you should take. The two forms of aspirin are: ?  Non-enteric-coated.This type of aspirin does not have a coating and is absorbed quickly. This type of aspirin also comes in a chewable form. ? Enteric-coated. This type of aspirin has a coating that releases the medicine very slowly. Enteric-coated aspirin might cause less stomach upset than non-enteric-coated aspirin. This type of aspirin should  not be chewed or crushed.  Limit alcohol intake to no more than 1 drink a day for nonpregnant women and 2 drinks a day for men. Drinking alcohol increases your risk of bleeding. One drink equals 12 oz of beer, 5 oz of wine, or 1 oz of hard liquor. Contact a health care provider if you:  Have unusual bleeding or bruising.  Have stomach pain or nausea.  Have ringing in your ears.  Have an allergic reaction that causes: ? Hives. ? Itchy skin. ? Swelling of the lips, tongue, or face. Get help right away if you:  Notice that your bowel movements are bloody, dark red, or black in color.  Vomit or cough up blood.  Have blood in your urine.  Cough, have noisy breathing (wheeze), or feel short of breath.  Have chest pain, especially if the pain spreads to the arms, back, neck, or jaw.  Have a severe headache, or a headache with confusion, or dizziness. These symptoms may represent a serious problem that is an emergency. Do not wait to see if the symptoms will go away. Get medical help right away. Call your local emergency services (911 in the [place] Do not drive yourself to the hospital. Summary  Aspirin can be used to help reduce the risk of blood clots, heart attacks, and other heart-related problems.  Daily use of aspirin can increase your risk of side effects. Your health care provider will help you determine whether it is safe and beneficial for you to take aspirin daily.  Take aspirin only as told by your health care provider. Make sure that you understand how much you can take and what form you can take. This information is not intended to replace advice given to you by your health care provider. Make sure you discuss any questions you have with your health care provider. Document Revised: 04/17/2017 Document Reviewed: 04/17/2017 Elsevier Patient Education  2020 [employer].

## 2019-07-28 NOTE — Progress Notes (Signed)
Cardiology Consultation:    Date:  07/28/2019   ID:  Derek Love, DOB [birth year], MRN [id number]  PCP:  Mackie Pai, PA-C  Cardiologist:  Jenne Campus, MD   Referring MD: Elise Benne   Chief Complaint  Patient presents with  . New Patient (Initial Visit)  . Palpitations  . Chest Pain    History of Present Illness:    Derek Love is a 49 y.o. male who is being seen today for the evaluation of palpitations, chest pain at the request of [place], Percell Miller, [place].  He is originally from [place] he came to [place] when he was 49 years old.  He had to learn [place] but he speaks fluently.  He was referred to [place] because of chest pain.  He has been experience chest pain for years.  Typically not related to exercise typically lasting for few seconds not associated with shortness of breath no other associated with sweating.  He reports applicant described episodes when he was laying down during the night he lay down on the left side and he started having more pain then.  He used to exercise on the regular basis but he is afraid to exercise right now because he is worried about his heart.  He described 1 episodes when he was walking on the treadmill and developed some tightness in the chest he had to stop.  That was few weeks ago.  That was unusual sensation for him. He does have hypertension which appears to be well controlled He smoked few cigarettes in his life.  He does not smoke now. He drinks some alcohol occasionally He does have multiple family members with premature coronary artery disease but there were heavy smokers. He used to exercise on the regular basis but now because of pandemic he does not do it. Another complaint that he have is palpitations.  He can feel his heart skipping for a moment, denies having any sustained arrhythmia.  He admits to drink a lot of coffee.  Past Medical History:  Diagnosis Date  . ADHD (attention deficit hyperactivity disorder)    . Allergic rhinitis   . Asthma   . GERD (gastroesophageal reflux disease)   . Headache(784.0)   . Hypertension   . Positive PPD, treated     Past Surgical History:  Procedure Laterality Date  . No Surgical History    . UPPER GI ENDOSCOPY  2001    Current Medications: Current Meds  Medication Sig  . fexofenadine (ALLEGRA) 180 MG tablet Take 180 mg by mouth daily.  . montelukast (SINGULAIR) 10 MG tablet Take 1 tablet (10 mg total) by mouth at bedtime.  Marland Kitchen olmesartan (BENICAR) 40 MG tablet TAKE ONE TABLET BY MOUTH ONE TIME DAILY   . omeprazole (PRILOSEC) 20 MG capsule Take 1 capsule (20 mg total) by mouth 2 (two) times daily.     Allergies:   Patient has no known allergies.   Social History   Socioeconomic History  . Marital status: Married    Spouse name: Not on file  . Number of children: Not on file  . Years of education: Not on file  . Highest education level: Not on file  Occupational History  . Not on file  Tobacco Use  . Smoking status: Never Smoker  . Smokeless tobacco: Never Used  Substance and Sexual Activity  . Alcohol use: Yes    Alcohol/week: 0.0 standard drinks    Comment: occ  . Drug use: No  . Sexual activity:  Yes  Other Topics Concern  . Not on file  Social History Narrative   Married   Occupation: Spanish- Recruitment consultant         Social Determinants of Corporate investment banker Strain:   . Difficulty of Paying Living Expenses: Not on file  Food Insecurity:   . Worried About Programme researcher, broadcasting/film/video in the Last Year: Not on file  . Ran Out of Food in the Last Year: Not on file  Transportation Needs:   . Lack of Transportation (Medical): Not on file  . Lack of Transportation (Non-Medical): Not on file  Physical Activity:   . Days of Exercise per Week: Not on file  . Minutes of Exercise per Session: Not on file  Stress:   . Feeling of Stress : Not on file  Social Connections:   . Frequency of Communication with Friends and Family: Not  on file  . Frequency of Social Gatherings with Friends and Family: Not on file  . Attends Religious Services: Not on file  . Active Member of Clubs or Organizations: Not on file  . Attends Banker Meetings: Not on file  . Marital Status: Not on file     Family History: The patient's family history includes Colon polyps in his father; Coronary artery disease in an other family member; Diabetes in an other family member; Hyperlipidemia in an other family member; Hypertension in an other family member; Hypertrophic cardiomyopathy in his sister; Lung cancer in an other family member; Stroke in his father and another family member. ROS:   Please see the history of present illness.    All 14 point review of systems negative except as described per history of present illness.  EKGs/Labs/Other Studies Reviewed:    The following studies were reviewed today: EKG done just few days ago by his primary care physician showed normal sinus rhythm, normal P interval, normal QS complex duration morphology. He also got troponin I checked at that time which was normal    Recent Labs: 08/24/2018: ALT 28; BUN 11; Creatinine, Ser 1.05; Hemoglobin 15.6; Platelets 199; Potassium 4.7; Sodium 141; TSH 1.510  Recent Lipid Panel    Component Value Date/Time   CHOL 194 08/24/2018 1040   TRIG 197 (H) 08/24/2018 1040   HDL 44 08/24/2018 1040   CHOLHDL 4.4 08/24/2018 1040   CHOLHDL 4 07/23/2016 0833   VLDL 42.8 (H) 07/23/2016 0833   LDLCALC 111 (H) 08/24/2018 1040   LDLDIRECT 116.0 07/23/2016 0833    Physical Exam:    VS:  BP 112/72   Pulse 72   Ht 6' (1.829 m)   Wt 220 lb (99.8 kg)   SpO2 97%   BMI 29.84 kg/m     Wt Readings from Last 3 Encounters:  07/28/19 220 lb (99.8 kg)  07/23/19 220 lb 3.2 oz (99.9 kg)  05/13/19 225 lb (102.1 kg)     GEN:  Well nourished, well developed in no acute distress HEENT: Normal NECK: No JVD; No carotid bruits LYMPHATICS: No  lymphadenopathy CARDIAC: RRR, no murmurs, no rubs, no gallops RESPIRATORY:  Clear to auscultation without rales, wheezing or rhonchi  ABDOMEN: Soft, non-tender, non-distended MUSCULOSKELETAL:  No edema; No deformity  SKIN: Warm and dry NEUROLOGIC:  Alert and oriented x 3 PSYCHIATRIC:  Normal affect   ASSESSMENT:    1. Essential hypertension   2. Atypical chest pain   3. PALPITATIONS, RECURRENT    PLAN:    In order of problems listed  above:  1. Atypical chest pain.  Majority of the symptoms that he described to me today are very atypical and I doubt very much in coronary artery disease.  However, at the end of conversation he described 1 episode of chest tightness that happened when he was on the treadmill.  Therefore, I think we need to proceed with stress testing.  I will schedule him to have exercise Cardiolite or if we cannot perform exercise Cardiolite we can do Lexiscan.  Until we do the test ask him to start taking 1 baby aspirin every single day and avoid heavy exercises. 2. Palpitations I will ask him to wear Zio patch for about a week to see if he got any significant arrhythmia.  However, I suspect his arrhythmia is related most likely to excessive use of caffeine. 3. Dyslipidemia with LDL of 112.  Would wait for results of his testing before decide about potential therapy. 4. We did talk about healthy lifestyle need for good diet as well as exercise on a regular basis but again asked him not to do any aggressive exercises until we do cardiac work-up.   Medication Adjustments/Labs and Tests Ordered: Current medicines are reviewed at length with the patient today.  Concerns regarding medicines are outlined above.  No orders of the defined types were placed in this encounter.  No orders of the defined types were placed in this encounter.   Signed, Georgeanna Lea, MD, [facility]. 07/28/2019 8:53 AM    [facility]

## 2019-08-20 ENCOUNTER — Inpatient Hospital Stay (HOSPITAL_COMMUNITY): Admission: RE | Admit: 2019-08-20 | Payer: 59 | Source: Ambulatory Visit

## 2019-08-20 NOTE — Progress Notes (Signed)
Spoke with patient about COVID testing before stress test on 08/24/19.  Patient stated he was positive on 05/23/19. His procedure is 93 days from his positive test so he will need to be retested for COVID before that procedure.  The patient expressed that he did not want to be tested again, patient stated that he wanted to cancel his stress at this time.  Instructed patient that he should contact the ordering physician to have that test cancelled

## 2019-08-23 ENCOUNTER — Telehealth: Payer: Self-pay | Admitting: Cardiology

## 2019-08-23 NOTE — Telephone Encounter (Signed)
Patient has rescheduled CUP EXERCISE TOLERANCE TEST for 10/12/19. Patient has also rescheduled follow up appointment for 10/18/19. Patient states that he is requesting assistance with having his COVID-19 test rescheduled prior to appointments. Please call.

## 2019-08-25 NOTE — Telephone Encounter (Signed)
Called patient informed him of covid test appointment. He verbally understood. No further questions.

## 2019-08-27 ENCOUNTER — Ambulatory Visit: Payer: 59 | Admitting: Cardiology

## 2019-09-21 ENCOUNTER — Other Ambulatory Visit: Payer: Self-pay | Admitting: Medical

## 2019-09-22 ENCOUNTER — Ambulatory Visit: Payer: 59 | Admitting: Cardiology

## 2019-09-23 ENCOUNTER — Ambulatory Visit: Payer: 59

## 2019-09-30 ENCOUNTER — Ambulatory Visit: Payer: 59 | Attending: Internal Medicine

## 2019-10-09 ENCOUNTER — Other Ambulatory Visit (HOSPITAL_COMMUNITY): Payer: 59

## 2019-10-18 ENCOUNTER — Ambulatory Visit: Payer: 59 | Admitting: Cardiology

## 2019-11-08 ENCOUNTER — Encounter: Payer: Self-pay | Admitting: Medical

## 2019-11-08 ENCOUNTER — Other Ambulatory Visit: Payer: Self-pay

## 2019-11-08 ENCOUNTER — Telehealth (INDEPENDENT_AMBULATORY_CARE_PROVIDER_SITE_OTHER): Payer: 59 | Admitting: Medical

## 2019-11-08 DIAGNOSIS — J40 Bronchitis, not specified as acute or chronic: Secondary | ICD-10-CM

## 2019-11-08 DIAGNOSIS — J309 Allergic rhinitis, unspecified: Secondary | ICD-10-CM | POA: Diagnosis not present

## 2019-11-08 DIAGNOSIS — R062 Wheezing: Secondary | ICD-10-CM | POA: Diagnosis not present

## 2019-11-08 MED ORDER — ALBUTEROL SULFATE HFA 108 (90 BASE) MCG/ACT IN AERS: 2.0000 | INHALATION_SPRAY | Freq: Four times a day (QID) | RESPIRATORY_TRACT | 0 refills | Status: DC | PRN

## 2019-11-08 MED ORDER — AZITHROMYCIN 250 MG PO TABS: ORAL_TABLET | ORAL | 0 refills | Status: DC

## 2019-11-08 MED ORDER — PREDNISONE 10 MG PO TABS: ORAL_TABLET | ORAL | 0 refills | Status: DC

## 2019-11-08 NOTE — Progress Notes (Signed)
   Subjective:    Patient ID: Derek Love, male    DOB: Oct 12, 1970, 49 y.o.   MRN: [id number]  HPI  Virtual Visit via Video Note  I connected with [name] on 11/08/19 at  4:00 PM EDT by a video enabled telemedicine application and verified that I am speaking with the correct person using two identifiers.   Pt did not check vitals today.  Location: Patient: home Provider: office   I discussed the limitations of evaluation and management by telemedicine and the availability of in person appointments. The patient expressed understanding and agreed to proceed.  History of Present Illness: Pt states he has very itching eyes, nasal congestion, runny nose, pnd and some some wheezing as well. With use of zyrtec and he states feels some better compared to allegra. Also using montelukast.  Signs and symptoms for last 2 weeks.  Pt optometrist gave him prednisone eye drops.  Some chest congestion and last 2 days brings up some mucus.  Pt had covid back in November. Pt states smell is decreased and taste is altered.     Observations/Objective:   General-no acute distress, pleasant, oriented. Lungs- on inspection lungs appear unlabored. Neck- no tracheal deviation or jvd on inspection. Neuro- gross motor function appears intact.  Assessment and Plan: He did describe recent allergic rhinitis signs and symptoms with mild wheeze.  In addition last couple days describing some chest congestion with productive cough.  For allergies recommended to continue Zyrtec, montelukast, restart steroid nasal spray and prescribing 4 days tapered prednisone.  For wheezing making albuterol available to use as needed.  For the last couple days of bronchitis type symptoms describing a azithromycin antibiotic.  Follow-up in 7 days or as needed.  Esperanza Richters, PA-C  Follow Up Instructions:    I discussed the assessment and treatment plan with the patient. The patient was provided an  opportunity to ask questions and all were answered. The patient agreed with the plan and demonstrated an understanding of the instructions.   The patient was advised to call back or seek an in-person evaluation if the symptoms worsen or if the condition fails to improve as anticipated.  Time spent with patient today was 20  minutes which consisted of chart review, discussing diagnosis, work up, treatment and documentation.   Esperanza Richters, PA-C    Review of Systems     Objective:   Physical Exam        Assessment & Plan:

## 2019-11-08 NOTE — Patient Instructions (Signed)
He did describe recent allergic rhinitis signs and symptoms with mild wheeze.  In addition last couple days describing some chest congestion with productive cough.  For allergies recommended to continue Zyrtec, montelukast, restart steroid nasal spray and prescribing 4 days tapered prednisone.  For wheezing making albuterol available to use as needed.  For the last couple days of bronchitis type symptoms describing a azithromycin antibiotic.  Follow-up in 7 days or as needed.

## 2019-11-22 ENCOUNTER — Encounter: Payer: Self-pay | Admitting: Medical

## 2019-11-23 ENCOUNTER — Telehealth: Payer: Self-pay | Admitting: Medical

## 2019-11-23 DIAGNOSIS — J309 Allergic rhinitis, unspecified: Secondary | ICD-10-CM

## 2019-11-23 MED ORDER — FLUTICASONE-SALMETEROL 100-50 MCG/DOSE IN AEPB: 1.0000 | INHALATION_SPRAY | Freq: Two times a day (BID) | RESPIRATORY_TRACT | 3 refills | Status: DC

## 2019-11-23 NOTE — Telephone Encounter (Signed)
Rx advair sent to pt pharmacy. 

## 2019-11-23 NOTE — Telephone Encounter (Signed)
Referral to allergist placed.

## 2019-12-22 ENCOUNTER — Other Ambulatory Visit: Payer: Self-pay | Admitting: Medical

## 2019-12-22 ENCOUNTER — Telehealth: Payer: Self-pay | Admitting: Cardiology

## 2019-12-22 NOTE — Telephone Encounter (Signed)
Order will be closed.   See letter below:  December 09, 2019   Derek Love [address] [place] [address]   Dear [name],  Our office, [facility], has made several attempts to contact you in order to schedule the Exercise Tolerance Test,  Dr. Bing Matter has ordered.  Please call us [phone] to schedule this test.  If we have had no response in 10 days, your order will be cancelled and your physician will be notified.  We look forward to hearing from you and participating in your health care needs.  Sincerely,  [facility] Scheduling Team

## 2020-03-18 ENCOUNTER — Other Ambulatory Visit: Payer: Self-pay | Admitting: Medical

## 2020-05-04 ENCOUNTER — Encounter: Payer: Self-pay | Admitting: Medical

## 2020-05-04 MED ORDER — MONTELUKAST SODIUM 10 MG PO TABS: 10.0000 mg | ORAL_TABLET | Freq: Every day | ORAL | 3 refills | Status: DC

## 2020-06-15 ENCOUNTER — Other Ambulatory Visit: Payer: Self-pay | Admitting: Medical

## 2020-07-14 ENCOUNTER — Other Ambulatory Visit: Payer: Self-pay | Admitting: Medical

## 2020-07-20 ENCOUNTER — Telehealth: Payer: Self-pay | Admitting: Medical

## 2020-07-20 ENCOUNTER — Encounter: Payer: Self-pay | Admitting: Medical

## 2020-07-20 DIAGNOSIS — Z111 Encounter for screening for respiratory tuberculosis: Secondary | ICD-10-CM

## 2020-07-20 NOTE — Telephone Encounter (Signed)
No recent TB screening , is it ok to schedule patient a lab or [place] to receive a tb test

## 2020-07-20 NOTE — Telephone Encounter (Signed)
Would prefer pt get schedule tomorrow or Monday for tb blood work.

## 2020-07-20 NOTE — Telephone Encounter (Signed)
Let pt know that with history of bcg tb vaccine it is not recommended to do tb skin test since can get false positive results.  But tb blood test not affected by bcg tb vaccine and are not expected to give false positive.   So getting chest xray not necessary. I think xray would be more expensive.

## 2020-07-20 NOTE — Telephone Encounter (Signed)
Called pt to get him scheduled for lab

## 2020-07-20 NOTE — Telephone Encounter (Signed)
Patient also needs Chest xrays , [place] .   Lab appointment made

## 2020-07-20 NOTE — Telephone Encounter (Signed)
Pt coming in Monday for labs and he also needs CXR because [place]

## 2020-07-24 ENCOUNTER — Other Ambulatory Visit: Payer: Self-pay

## 2020-07-24 ENCOUNTER — Other Ambulatory Visit (INDEPENDENT_AMBULATORY_CARE_PROVIDER_SITE_OTHER): Payer: 59

## 2020-07-24 DIAGNOSIS — Z111 Encounter for screening for respiratory tuberculosis: Secondary | ICD-10-CM

## 2020-07-26 LAB — QUANTIFERON-TB GOLD PLUS
Mitogen-NIL: >10 IU/mL
NIL: 0.04 IU/mL
QuantiFERON-TB Gold Plus: NEGATIVE
TB1-NIL: 0.01 IU/mL
TB2-NIL: 0.01 IU/mL

## 2020-08-07 ENCOUNTER — Encounter: Payer: 59 | Admitting: Medical

## 2020-08-08 ENCOUNTER — Other Ambulatory Visit: Payer: Self-pay

## 2020-08-09 ENCOUNTER — Ambulatory Visit (INDEPENDENT_AMBULATORY_CARE_PROVIDER_SITE_OTHER): Payer: 59 | Admitting: Medical

## 2020-08-09 ENCOUNTER — Other Ambulatory Visit: Payer: Self-pay

## 2020-08-09 VITALS — BP 122/61 | HR 58 | Resp 20 | Ht 72.0 in | Wt 221.0 lb

## 2020-08-09 DIAGNOSIS — J302 Other seasonal allergic rhinitis: Secondary | ICD-10-CM

## 2020-08-09 DIAGNOSIS — Z7185 Encounter for immunization safety counseling: Secondary | ICD-10-CM | POA: Diagnosis not present

## 2020-08-09 DIAGNOSIS — Z113 Encounter for screening for infections with a predominantly sexual mode of transmission: Secondary | ICD-10-CM | POA: Diagnosis not present

## 2020-08-09 DIAGNOSIS — J4541 Moderate persistent asthma with (acute) exacerbation: Secondary | ICD-10-CM | POA: Diagnosis not present

## 2020-08-09 DIAGNOSIS — Q6470 Unspecified congenital malformation of bladder and urethra: Secondary | ICD-10-CM

## 2020-08-09 DIAGNOSIS — Z1211 Encounter for screening for malignant neoplasm of colon: Secondary | ICD-10-CM

## 2020-08-09 DIAGNOSIS — Z0000 Encounter for general adult medical examination without abnormal findings: Secondary | ICD-10-CM | POA: Diagnosis not present

## 2020-08-09 LAB — CBC WITH DIFFERENTIAL/PLATELET
Basophils Absolute: 0 10*3/uL (ref 0.0–0.1)
Basophils Relative: 0.8 % (ref 0.0–3.0)
Eosinophils Absolute: 0.2 10*3/uL (ref 0.0–0.7)
Eosinophils Relative: 4.2 % (ref 0.0–5.0)
HCT: 45.4 % (ref 39.0–52.0)
Hemoglobin: 15.5 g/dL (ref 13.0–17.0)
Lymphocytes Relative: 32.9 % (ref 12.0–46.0)
Lymphs Abs: 1.8 10*3/uL (ref 0.7–4.0)
MCHC: 34.2 g/dL (ref 30.0–36.0)
MCV: 90.9 fl (ref 78.0–100.0)
Monocytes Absolute: 0.4 10*3/uL (ref 0.1–1.0)
Monocytes Relative: 7.6 % (ref 3.0–12.0)
Neutro Abs: 2.9 10*3/uL (ref 1.4–7.7)
Neutrophils Relative %: 54.5 % (ref 43.0–77.0)
Platelets: 178 10*3/uL (ref 150.0–400.0)
RBC: 5 Mil/uL (ref 4.22–5.81)
RDW: 13.3 % (ref 11.5–15.5)
WBC: 5.4 10*3/uL (ref 4.0–10.5)

## 2020-08-09 LAB — LIPID PANEL
Cholesterol: 229 mg/dL — ABNORMAL HIGH (ref 0–200)
HDL: 53.9 mg/dL (ref >39.00)
LDL Cholesterol: 153 mg/dL — ABNORMAL HIGH (ref 0–99)
NonHDL: 175.35
Total CHOL/HDL Ratio: 4
Triglycerides: 112 mg/dL (ref 0.0–149.0)
VLDL: 22.4 mg/dL (ref 0.0–40.0)

## 2020-08-09 LAB — COMPREHENSIVE METABOLIC PANEL
ALT: 23 U/L (ref 0–53)
AST: 19 U/L (ref 0–37)
Albumin: 4.7 g/dL (ref 3.5–5.2)
Alkaline Phosphatase: 67 U/L (ref 39–117)
BUN: 19 mg/dL (ref 6–23)
CO2: 33 mEq/L — ABNORMAL HIGH (ref 19–32)
Calcium: 9.9 mg/dL (ref 8.4–10.5)
Chloride: 100 mEq/L (ref 96–112)
Creatinine, Ser: 1.05 mg/dL (ref 0.40–1.50)
GFR: 84.26 mL/min (ref >60.00)
Glucose, Bld: 98 mg/dL (ref 70–99)
Potassium: 4.5 mEq/L (ref 3.5–5.1)
Sodium: 139 mEq/L (ref 135–145)
Total Bilirubin: 0.9 mg/dL (ref 0.2–1.2)
Total Protein: 6.8 g/dL (ref 6.0–8.3)

## 2020-08-09 MED ORDER — AZELASTINE HCL 0.1 % NA SOLN: 2.0000 | Freq: Two times a day (BID) | NASAL | 3 refills | Status: DC

## 2020-08-09 MED ORDER — BUDESONIDE-FORMOTEROL FUMARATE 160-4.5 MCG/ACT IN AERO: 2.0000 | INHALATION_SPRAY | Freq: Two times a day (BID) | RESPIRATORY_TRACT | 3 refills | Status: DC

## 2020-08-09 NOTE — Patient Instructions (Addendum)
For you wellness exam today I have ordered cbc, cmp, lipid panel and hiv.  Vaccine given today.   Recommend exercise and healthy diet.  We will let you know lab results as they come in.  Follow up date appointment will be determined after lab review.   Referral to GI MD placed.  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   Preventive Care 29-47 Years Old, Male Preventive care refers to lifestyle choices and visits with your health care provider that can promote health and wellness. This includes:  A yearly physical exam. This is also called an annual wellness visit.  Regular dental and eye exams.  Immunizations.  Screening for certain conditions.  Healthy lifestyle choices, such as: ? Eating a healthy diet. ? Getting regular exercise. ? Not using drugs or products that contain nicotine and tobacco. ? Limiting alcohol use. What can I expect for my preventive care visit? Physical exam Your health care provider will check your:  Height and weight. These may be used to calculate your BMI (body mass index). BMI is a measurement that tells if you are at a healthy weight.  Heart rate and blood pressure.  Body temperature.  Skin for abnormal spots. Counseling Your health care provider may ask you questions about your:  Past medical problems.  Family's medical history.  Alcohol, tobacco, and drug use.  Emotional well-being.  Home life and relationship well-being.  Sexual activity.  Diet, exercise, and sleep habits.  Work and work Astronomer.  Access to firearms. What immunizations do I need? Vaccines are usually given at various ages, according to a schedule. Your health care provider will recommend vaccines for you based on your age, medical history, and lifestyle or other factors, such as travel or where you work.   What tests do I need? Blood tests  Lipid and cholesterol levels. These may be checked every 5 years, or more often if you are over  49 years old.  Hepatitis C test.  Hepatitis B test. Screening  Lung cancer screening. You may have this screening every year starting at age 36 if you have a 30-pack-year history of smoking and currently smoke or have quit within the past 15 years.  Prostate cancer screening. Recommendations will vary depending on your family history and other risks.  Genital exam to check for testicular cancer or hernias.  Colorectal cancer screening. ? All adults should have this screening starting at age 67 and continuing until age 51. ? Your health care provider may recommend screening at age 13 if you are at increased risk. ? You will have tests every 1-10 years, depending on your results and the type of screening test.  Diabetes screening. ? This is done by checking your blood sugar (glucose) after you have not eaten for a while (fasting). ? You may have this done every 1-3 years.  STD (sexually transmitted disease) testing, if you are at risk. Follow these instructions at home: Eating and drinking  Eat a diet that includes fresh fruits and vegetables, whole grains, lean protein, and low-fat dairy products.  Take vitamin and mineral supplements as recommended by your health care provider.  Do not drink alcohol if your health care provider tells you not to drink.  If you drink alcohol: ? Limit how much you have to 0-2 drinks a day. ? Be aware of how much alcohol is in your drink. In the [place] one drink equals one 12 oz bottle of beer (355 mL), one 5 oz glass of  wine (148 mL), or one 1 oz glass of hard liquor (44 mL).   Lifestyle  Take daily care of your teeth and gums. Brush your teeth every morning and night with fluoride toothpaste. Floss one time each day.  Stay active. Exercise for at least 30 minutes 5 or more days each week.  Do not use any products that contain nicotine or tobacco, such as cigarettes, e-cigarettes, and chewing tobacco. If you need help quitting, ask your health  care provider.  Do not use drugs.  If you are sexually active, practice safe sex. Use a condom or other form of protection to prevent STIs (sexually transmitted infections).  If told by your health care provider, take low-dose aspirin daily starting at age 42.  Find healthy ways to cope with stress, such as: ? Meditation, yoga, or listening to music. ? Journaling. ? Talking to a trusted person. ? Spending time with friends and family. Safety  Always wear your seat belt while driving or riding in a vehicle.  Do not drive: ? If you have been drinking alcohol. Do not ride with someone who has been drinking. ? When you are tired or distracted. ? While texting.  Wear a helmet and other protective equipment during sports activities.  If you have firearms in your house, make sure you follow all gun safety procedures. What's next?  Go to your health care provider once a year for an annual wellness visit.  Ask your health care provider how often you should have your eyes and teeth checked.  Stay up to date on all vaccines. This information is not intended to replace advice given to you by your health care provider. Make sure you discuss any questions you have with your health care provider. Document Revised: 03/16/2019 Document Reviewed: 06/11/2018 Elsevier Patient Education  2021 [employer].

## 2020-08-09 NOTE — Progress Notes (Addendum)
Subjective:    Patient ID: Derek Love, male    DOB: Dec 08, 1970, 50 y.o.   MRN: [id number]  HPI  Pt in for wellness exam. He is fasting.  Pt works for [employer]. Pt has been doing cross fit. Pt state improved diet. Non smoker. Allcohol once a week.  Pt has not had covid vaccine but he did have covid in 2020. He declines flu vaccine.  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      Review of Systems  Constitutional: Negative for chills, fatigue and fever.  HENT: Negative for congestion, ear pain and facial swelling.   Respiratory: Negative for cough, chest tightness, shortness of breath and wheezing.        Does mention that have to use albuterol frequently recently. Last week 3 times wheeze while working out. Pt states albuterol helped. He anticipate in spring time will get worse. Pt wants to consider advair or symbicort.  Cardiovascular: Negative for chest pain and palpitations.  Gastrointestinal: Negative for abdominal pain.  Genitourinary: Negative for dysuria, flank pain and frequency.  Musculoskeletal: Negative for back pain and myalgias.  Skin: Negative for rash.  Neurological: Negative for dizziness, seizures, speech difficulty, weakness, light-headedness and headaches.  Hematological: Negative for adenopathy. Does not bruise/bleed easily.  Psychiatric/Behavioral: Negative for behavioral problems and confusion.    Past Medical History:  Diagnosis Date  . ADHD (attention deficit hyperactivity disorder)   . Allergic rhinitis   . Asthma   . GERD (gastroesophageal reflux disease)   . Headache(784.0)   . Hypertension   . Positive PPD, treated      Social History   Socioeconomic History  . Marital status: Married    Spouse name: Not on file  . Number of children: Not on file  . Years of education: Not on file  . Highest education level: Not on file  Occupational History   . Not on file  Tobacco Use  . Smoking status: Never Smoker  . Smokeless tobacco: Never Used  Vaping Use  . Vaping Use: Never used  Substance and Sexual Activity  . Alcohol use: Yes    Alcohol/week: 0.0 standard drinks    Comment: occ  . Drug use: No  . Sexual activity: Yes  Other Topics Concern  . Not on file  Social History Narrative   Married   Occupation: Spanish- Recruitment consultant         Social Determinants of Corporate investment banker Strain: Not on [employer] Insecurity: Not on file  Transportation Needs: Not on file  Physical Activity: Not on file  Stress: Not on file  Social Connections: Not on file  Intimate Partner Violence: Not on file    Past Surgical History:  Procedure Laterality Date  . No Surgical History    . UPPER GI ENDOSCOPY  2001    Family History  Problem Relation Age of Onset  . Coronary artery disease Other   . Diabetes Other   . Hyperlipidemia Other   . Hypertension Other   . Lung cancer Other   . Stroke Other   . Stroke Father   . Colon polyps Father   . Hypertrophic cardiomyopathy Sister     No Known Allergies  Current Outpatient Medications on File Prior to Visit  Medication Sig Dispense Refill  . albuterol (VENTOLIN HFA) 108 (90 Base) MCG/ACT inhaler Inhale 2 puffs into the lungs  every 6 (six) hours as needed. 18 g 0  . aspirin EC 81 MG tablet Take 1 tablet (81 mg total) by mouth daily. 90 tablet 3  . fexofenadine (ALLEGRA) 180 MG tablet Take 180 mg by mouth daily.    . Fluticasone-Salmeterol (ADVAIR) 100-50 MCG/DOSE AEPB Inhale 1 puff into the lungs 2 (two) times daily. 1 each 3  . montelukast (SINGULAIR) 10 MG tablet Take 1 tablet (10 mg total) by mouth at bedtime. 90 tablet 3  . olmesartan (BENICAR) 40 MG tablet TAKE ONE TABLET BY MOUTH ONE TIME DAILY 30 tablet 2  . omeprazole (PRILOSEC) 20 MG capsule Take 1 capsule (20 mg total) by mouth 2 (two) times daily. 60 capsule 3  . azithromycin (ZITHROMAX) 250 MG tablet Take  2 tablets by mouth on day 1, followed by 1 tablet by mouth daily for 4 days. (Patient not taking: Reported on 08/09/2020) 6 tablet 0  . famotidine (PEPCID) 20 MG tablet Take 1 tablet (20 mg total) by mouth daily. (Patient not taking: No sig reported) 30 tablet 3  . predniSONE (DELTASONE) 10 MG tablet 4 tab po day 1, 3 tab po day 2, 2 tab po day 3, 1 tab po day 4. (Patient not taking: Reported on 08/09/2020) 10 tablet 0   No current facility-administered medications on file prior to visit.    BP 122/61   Pulse (!) 58   Resp 20   Ht 6' (1.829 m)   Wt 221 lb (100.2 kg)   SpO2 99%   BMI 29.97 kg/m       Objective:   Physical Exam  General Mental Status- Alert. General Appearance- Not in acute distress.   Skin General: Color- Normal Color. Moisture- Normal Moisture.  Neck Carotid Arteries- Normal color. Moisture- Normal Moisture. No carotid bruits. No JVD.  Chest and Lung Exam Auscultation: Breath Sounds:-Normal.  Cardiovascular Auscultation:Rythm- Regular. Murmurs & Other Heart Sounds:Auscultation of the heart reveals- No Murmurs.  Abdomen Inspection:-Inspeection Normal. Palpation/Percussion:Note:No mass. Palpation and Percussion of the abdomen reveal- Non Tender, Non Distended + BS, no rebound or guarding.   Neurologic Cranial Nerve exam:- CN III-XII intact(No nystagmus), symmetric smile. Strength:- 5/5 equal and symmetric strength both upper and lower extremities.  Genital exam- looks like 2 urethral opening.      Assessment & Plan:  For you wellness exam today I have ordered cbc, cmp, lipid panel and hiv.  Vaccine given today.   Recommend exercise and healthy diet.  We will let you know lab results as they come in.  Follow up date appointment will be determined after lab review.   Referral to GI MD placed.  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  Esperanza Richters, [place]   [id number] charge as well. Addressed urethral abnormality and addressed his  asthma which not ideally controlled. Also has spring allergies. Discussed can try astelin.

## 2020-08-09 NOTE — Addendum Note (Signed)
Addended by: Gwenevere Abbot on: 08/09/2020 08:35 AM   Modules accepted: Orders

## 2020-08-10 LAB — HIV ANTIBODY (ROUTINE TESTING W REFLEX): HIV 1&2 Ab, 4th Generation: NONREACTIVE

## 2020-10-15 ENCOUNTER — Other Ambulatory Visit: Payer: Self-pay | Admitting: Medical

## 2020-10-17 ENCOUNTER — Encounter: Payer: Self-pay | Admitting: Medical

## 2020-10-20 ENCOUNTER — Telehealth: Payer: 59 | Admitting: Family Medicine

## 2020-11-14 ENCOUNTER — Other Ambulatory Visit: Payer: Self-pay | Admitting: Medical

## 2020-12-21 ENCOUNTER — Other Ambulatory Visit: Payer: Self-pay | Admitting: Medical

## 2020-12-22 ENCOUNTER — Other Ambulatory Visit: Payer: Self-pay | Admitting: Medical

## 2020-12-25 MED ORDER — OLMESARTAN MEDOXOMIL 40 MG PO TABS: 40.0000 mg | ORAL_TABLET | Freq: Every day | ORAL | 0 refills | Status: DC

## 2020-12-25 NOTE — Telephone Encounter (Signed)
Refilled olmesartan again. Did on 12-22-20 per epic review. Pt sent message asking for refill? Maybe 1st one did not go thru?

## 2021-01-19 ENCOUNTER — Other Ambulatory Visit: Payer: Self-pay | Admitting: Medical

## 2021-02-13 ENCOUNTER — Other Ambulatory Visit: Payer: Self-pay | Admitting: Medical

## 2021-03-11 ENCOUNTER — Other Ambulatory Visit: Payer: Self-pay | Admitting: Medical

## 2021-04-08 ENCOUNTER — Other Ambulatory Visit: Payer: Self-pay | Admitting: Medical

## 2021-05-05 ENCOUNTER — Other Ambulatory Visit: Payer: Self-pay | Admitting: Medical

## 2021-05-14 ENCOUNTER — Encounter: Payer: Self-pay | Admitting: Medical

## 2021-05-14 ENCOUNTER — Telehealth: Payer: Self-pay | Admitting: Medical

## 2021-05-14 MED ORDER — FLUTICASONE-SALMETEROL 100-50 MCG/ACT IN AEPB: 1.0000 | INHALATION_SPRAY | Freq: Two times a day (BID) | RESPIRATORY_TRACT | 3 refills | Status: DC

## 2021-05-14 NOTE — Telephone Encounter (Signed)
Rx advair sent to pt pharmacy. 

## 2021-05-18 ENCOUNTER — Other Ambulatory Visit: Payer: Self-pay | Admitting: Medical

## 2021-06-04 ENCOUNTER — Other Ambulatory Visit: Payer: Self-pay | Admitting: Medical

## 2021-08-03 ENCOUNTER — Other Ambulatory Visit: Payer: Self-pay | Admitting: Medical

## 2021-08-29 IMAGING — CR DG CHEST 2V
2 series · 2 of 2 positions shown · non-contrast
Comparison: None.

CLINICAL DATA: Atypical chest pain

EXAM:
CHEST - 2 VIEW

[w chest lat]
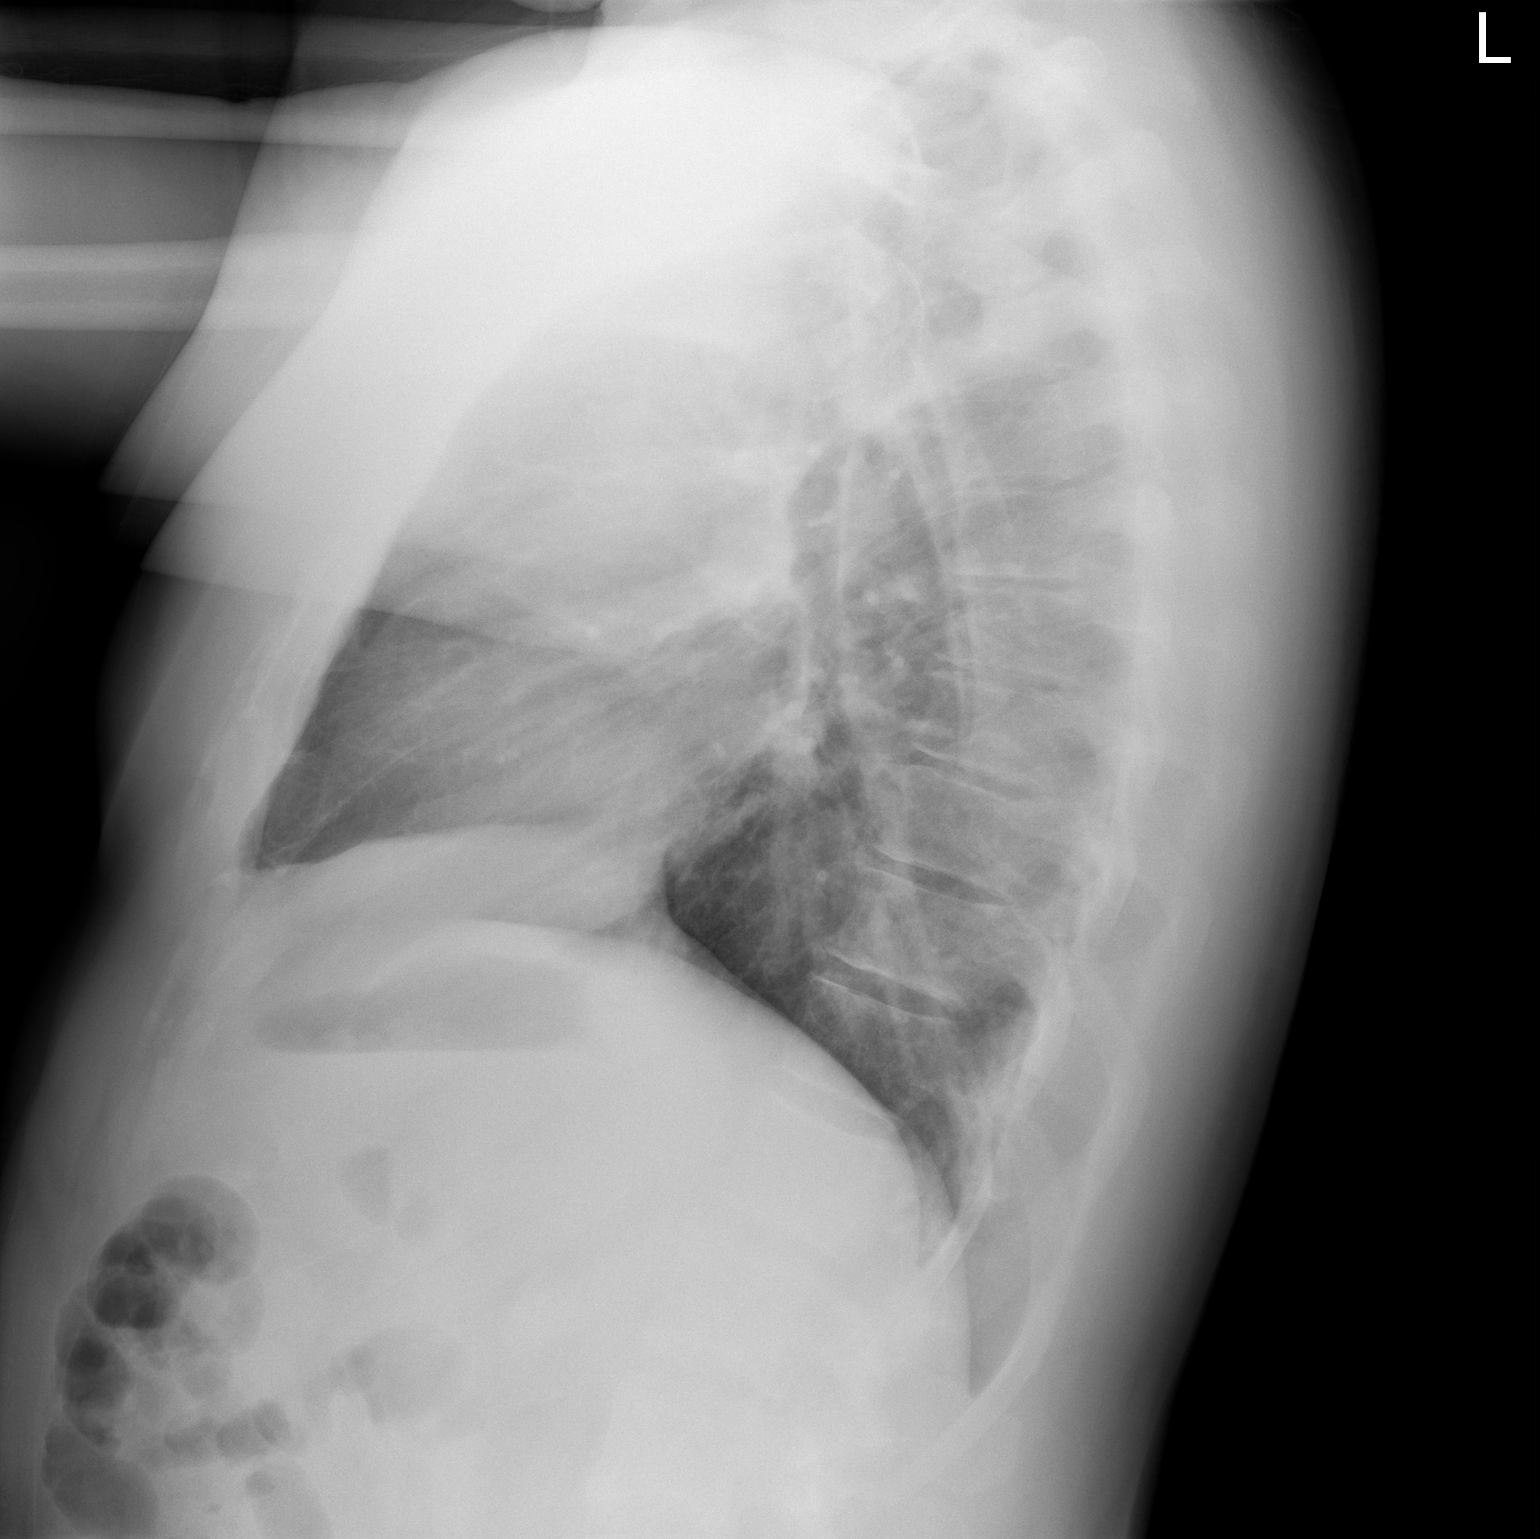

[w chest pa]
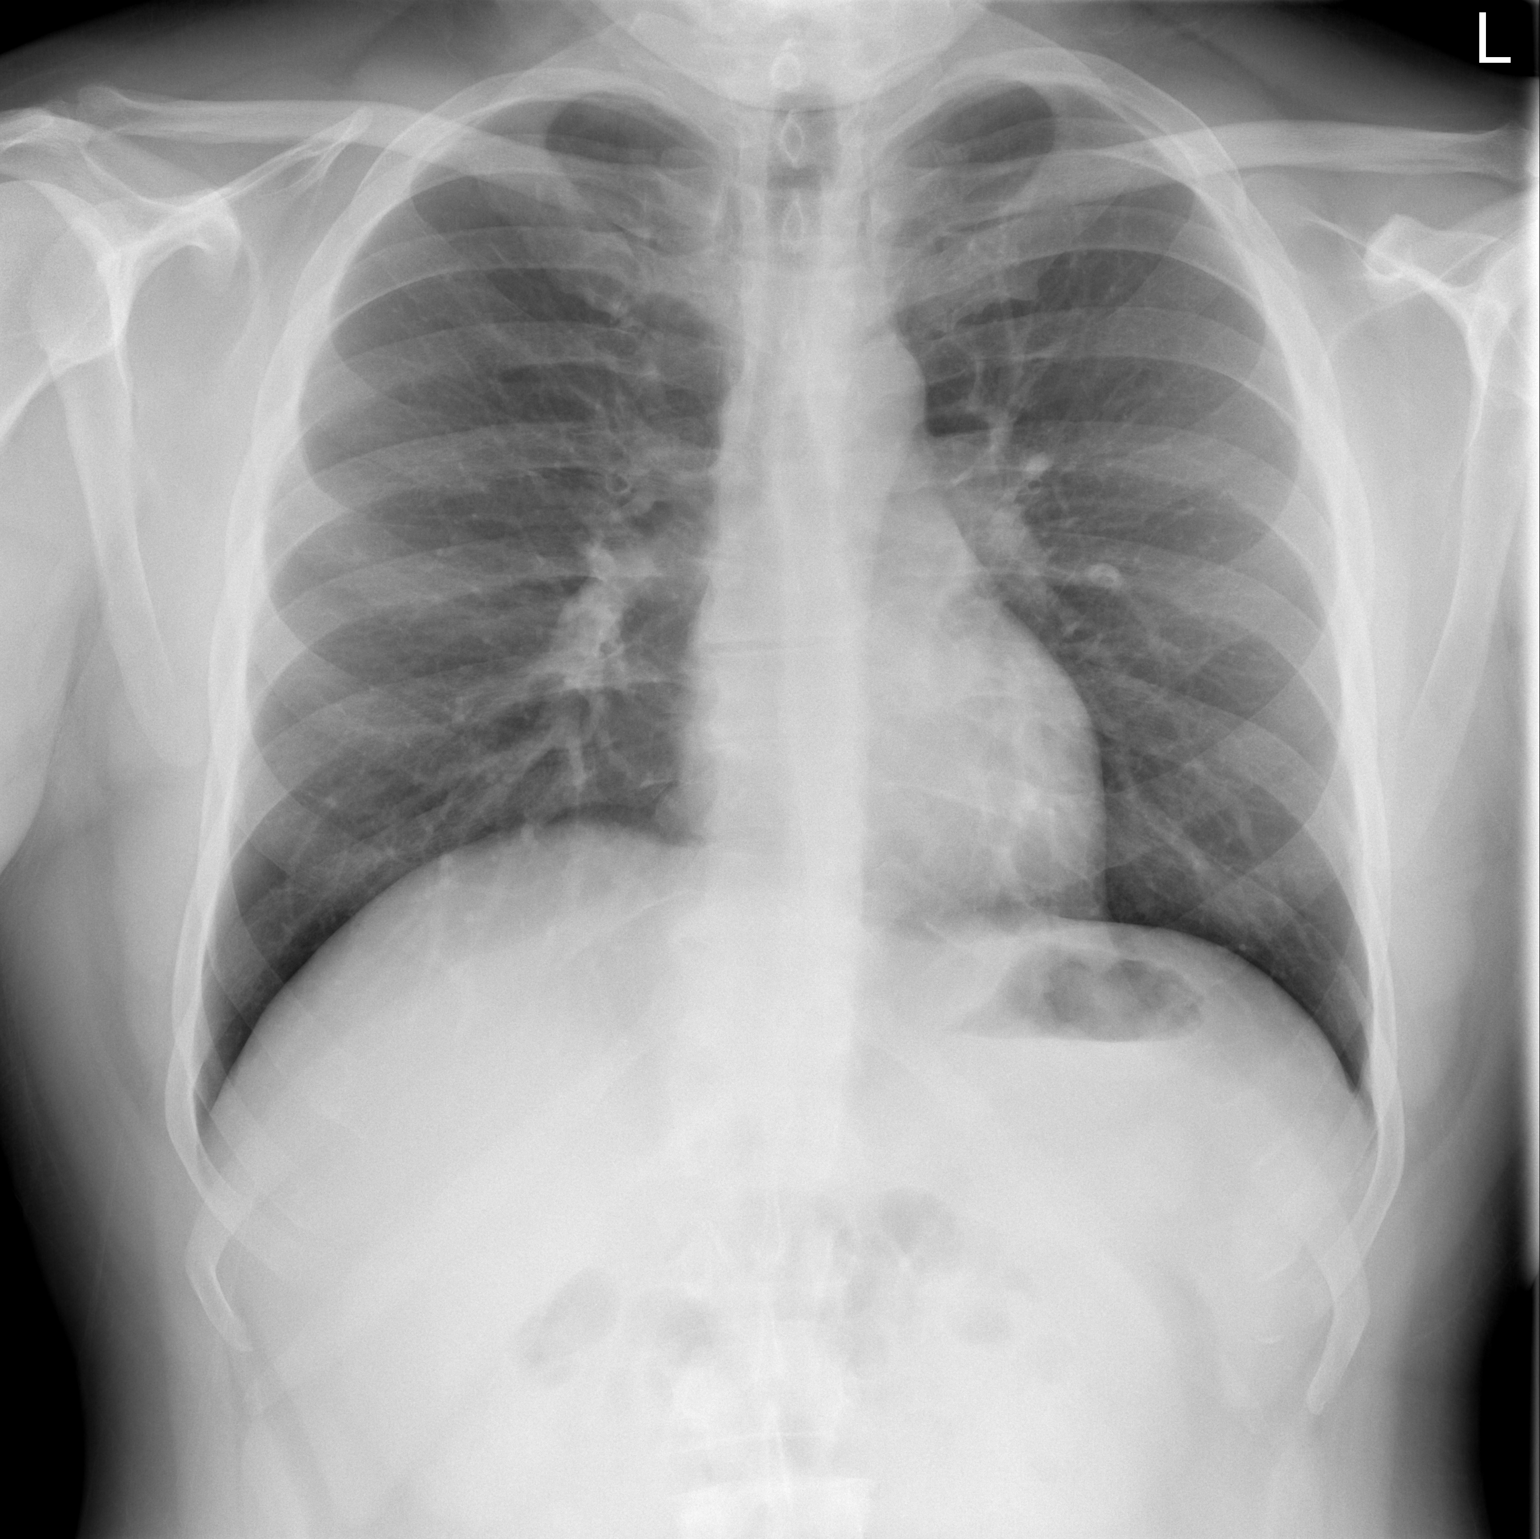

[2 of 2 positions shown; findings below may reference images not displayed]

FINDINGS: Lungs are clear. Heart size and pulmonary vascularity are normal. No
adenopathy. No pneumothorax. No bone lesions.
IMPRESSION: No abnormality noted.

## 2021-09-01 ENCOUNTER — Other Ambulatory Visit: Payer: Self-pay | Admitting: Medical

## 2021-09-04 ENCOUNTER — Encounter: Payer: 59 | Admitting: Medical

## 2021-10-02 ENCOUNTER — Other Ambulatory Visit: Payer: Self-pay | Admitting: Medical

## 2021-10-30 ENCOUNTER — Other Ambulatory Visit: Payer: Self-pay | Admitting: Medical

## 2021-11-05 ENCOUNTER — Other Ambulatory Visit: Payer: Self-pay | Admitting: Medical

## 2021-11-15 ENCOUNTER — Telehealth: Payer: Self-pay | Admitting: Medical

## 2021-11-15 NOTE — Telephone Encounter (Signed)
Ok to transfer. 

## 2021-11-15 NOTE — Telephone Encounter (Signed)
Patient notified of update  and verbalized understanding. 

## 2021-11-15 NOTE — Telephone Encounter (Signed)
Pt is calling and would like to transfer to Derek Love

## 2021-11-20 NOTE — Telephone Encounter (Signed)
Pt have a appt 12/18/21.

## 2021-12-03 ENCOUNTER — Other Ambulatory Visit: Payer: Self-pay | Admitting: Medical

## 2021-12-07 ENCOUNTER — Encounter: Payer: Self-pay | Admitting: Medical

## 2021-12-24 ENCOUNTER — Ambulatory Visit (INDEPENDENT_AMBULATORY_CARE_PROVIDER_SITE_OTHER): Payer: 59 | Admitting: Medical

## 2021-12-24 NOTE — Progress Notes (Signed)
No show

## 2022-01-04 ENCOUNTER — Ambulatory Visit (INDEPENDENT_AMBULATORY_CARE_PROVIDER_SITE_OTHER): Payer: 59 | Admitting: Family Medicine

## 2022-01-04 ENCOUNTER — Encounter: Payer: Self-pay | Admitting: Family Medicine

## 2022-01-04 VITALS — BP 124/80 | HR 53 | Temp 98.5°F | Wt 222.0 lb

## 2022-01-04 DIAGNOSIS — J4521 Mild intermittent asthma with (acute) exacerbation: Secondary | ICD-10-CM | POA: Diagnosis not present

## 2022-01-04 DIAGNOSIS — J40 Bronchitis, not specified as acute or chronic: Secondary | ICD-10-CM

## 2022-01-04 MED ORDER — IPRATROPIUM-ALBUTEROL 0.5-2.5 (3) MG/3ML IN SOLN
3.0000 mL | Freq: Once | RESPIRATORY_TRACT | Status: AC
  Administered 2022-01-04: 3 mL via RESPIRATORY_TRACT

## 2022-01-04 MED ORDER — METHYLPREDNISOLONE ACETATE 80 MG/ML IJ SUSP
80.0000 mg | Freq: Once | INTRAMUSCULAR | Status: AC
  Administered 2022-01-04: 80 mg via INTRAMUSCULAR

## 2022-01-04 MED ORDER — METHYLPREDNISOLONE ACETATE 40 MG/ML IJ SUSP
40.0000 mg | Freq: Once | INTRAMUSCULAR | Status: AC
  Administered 2022-01-04: 40 mg via INTRAMUSCULAR

## 2022-01-04 MED ORDER — AZITHROMYCIN 250 MG PO TABS: ORAL_TABLET | ORAL | 0 refills | Status: DC

## 2022-01-04 NOTE — Progress Notes (Signed)
   Subjective:    Patient ID: Derek Love, male    DOB: [birth year], 51 y.o.   MRN: [id number]  HPI Here for one week of chest tightness, a dry cough, SOB, and wheezing. No fever. No ST but his voice is hoarse. He has been using his inhaler with mixed results. His wife had a similar illness which resolved with treatment.    Review of Systems  Constitutional: Negative.   HENT:  Positive for voice change. Negative for congestion, ear pain, postnasal drip and sore throat.   Eyes: Negative.   Respiratory:  Positive for cough, chest tightness, shortness of breath and wheezing.   Cardiovascular: Negative.        Objective:   Physical Exam Constitutional:      Appearance: Normal appearance. He is not ill-appearing.  HENT:     Right Ear: Tympanic membrane, ear canal and external ear normal.     Left Ear: Tympanic membrane, ear canal and external ear normal.     Nose: Nose normal.     Mouth/Throat:     Pharynx: Oropharynx is clear.     Comments: His voice is quite hoarse  Eyes:     Conjunctiva/sclera: Conjunctivae normal.  Pulmonary:     Effort: Pulmonary effort is normal.     Breath sounds: Wheezing and rhonchi present. No rales.  Lymphadenopathy:     Cervical: No cervical adenopathy.  Neurological:     Mental Status: He is alert.           Assessment & Plan:  Bronchitis with an asthma exacerbation. He was given a nebulizer treatment with Duoneb, and this helped him breathe more easily. He is given a shot of DepoMedrol and a Zpack. Follow up as needed.  Gershon Crane, MD

## 2022-01-04 NOTE — Addendum Note (Signed)
Addended by: Carola Rhine on: 01/04/2022 11:40 AM   Modules accepted: Orders

## 2022-01-21 ENCOUNTER — Other Ambulatory Visit: Payer: Self-pay | Admitting: Medical

## 2022-02-01 ENCOUNTER — Encounter: Payer: Self-pay | Admitting: Adult Health

## 2022-02-01 ENCOUNTER — Ambulatory Visit (INDEPENDENT_AMBULATORY_CARE_PROVIDER_SITE_OTHER): Payer: 59 | Admitting: Adult Health

## 2022-02-01 VITALS — BP 110/80 | HR 60 | Temp 98.2°F | Ht 71.75 in | Wt 221.0 lb

## 2022-02-01 DIAGNOSIS — I10 Essential (primary) hypertension: Secondary | ICD-10-CM | POA: Diagnosis not present

## 2022-02-01 DIAGNOSIS — Z0000 Encounter for general adult medical examination without abnormal findings: Secondary | ICD-10-CM

## 2022-02-01 DIAGNOSIS — K219 Gastro-esophageal reflux disease without esophagitis: Secondary | ICD-10-CM

## 2022-02-01 DIAGNOSIS — J4521 Mild intermittent asthma with (acute) exacerbation: Secondary | ICD-10-CM

## 2022-02-01 DIAGNOSIS — R5383 Other fatigue: Secondary | ICD-10-CM

## 2022-02-01 DIAGNOSIS — Z125 Encounter for screening for malignant neoplasm of prostate: Secondary | ICD-10-CM

## 2022-02-01 DIAGNOSIS — E785 Hyperlipidemia, unspecified: Secondary | ICD-10-CM

## 2022-02-01 DIAGNOSIS — J302 Other seasonal allergic rhinitis: Secondary | ICD-10-CM

## 2022-02-01 DIAGNOSIS — Z1159 Encounter for screening for other viral diseases: Secondary | ICD-10-CM

## 2022-02-01 NOTE — Patient Instructions (Signed)
It was great meeting you today   Please follow up for your labs   I am going to have you cut your benicar in half and send me your blood pressure results through my chart.

## 2022-02-01 NOTE — Progress Notes (Signed)
Patient presents to clinic today to establish care. He is a pleasant 51 year old male who  has a past medical history of ADHD (attention deficit hyperactivity disorder), Allergic rhinitis, Asthma, GERD (gastroesophageal reflux disease), Headache(784.0), Hypertension, and Positive PPD, treated.    Acute Concerns: Establish Care  Chronic Issues: HTN -managed with Benicar 40 mg daily.  He denies lightheadedness, chest pain, or shortness of breath. He has been experiencing fatigue and dizziness from time to time. He reports making extensive lifestyle changes since being placed on Benicar.  BP Readings from Last 3 Encounters:  02/01/22 110/80  01/04/22 124/80  08/09/20 122/61   Seasonal Allergies/Asthma -managed with Singulair 10 mg nightl  and will use an albuterol inhaler as needed.  GERD- managed with prevacid 15 mg daily.   Hyperlipidemia - not currently on medication Lab Results  Component Value Date   CHOL 229 (H) 08/09/2020   HDL 53.90 08/09/2020   LDLCALC 153 (H) 08/09/2020   LDLDIRECT 116.0 07/23/2016   TRIG 112.0 08/09/2020   CHOLHDL 4 08/09/2020     Health Maintenance: Dental -- Routine Care  Vision -- Routine Care - has contacts  Immunizations -- Never had Covid 19 shots.  Colonoscopy -- Never had - would like to hold off for a year or two  Diet: Tries to eat healthy.  Exercise: exercises frequently; Does crossfit 4-5 times a week   Wt Readings from Last 10 Encounters:  02/01/22 221 lb (100.2 kg)  01/04/22 222 lb (100.7 kg)  08/09/20 221 lb (100.2 kg)  07/28/19 220 lb (99.8 kg)  07/23/19 220 lb 3.2 oz (99.9 kg)  05/13/19 225 lb (102.1 kg)  05/06/19 225 lb 3.2 oz (102.2 kg)  02/16/19 222 lb 3.2 oz (100.8 kg)  08/24/18 220 lb (99.8 kg)  06/02/18 216 lb 6 oz (98.1 kg)     Past Medical History:  Diagnosis Date   ADHD (attention deficit hyperactivity disorder)    Allergic rhinitis    Asthma    GERD (gastroesophageal reflux disease)    Headache(784.0)     Hypertension    Positive PPD, treated     Past Surgical History:  Procedure Laterality Date   No Surgical History     UPPER GI ENDOSCOPY  2001    Current Outpatient Medications on File Prior to Visit  Medication Sig Dispense Refill   fexofenadine (ALLEGRA) 180 MG tablet Take 180 mg by mouth daily.     lansoprazole (PREVACID) 15 MG capsule Take 15 mg by mouth daily at 12 noon.     olmesartan (BENICAR) 40 MG tablet Take 1 tablet (40 mg total) by mouth daily. 30 tablet 0   albuterol (VENTOLIN HFA) 108 (90 Base) MCG/ACT inhaler Inhale 2 puffs into the lungs every 6 (six) hours as needed. (Patient not taking: Reported on 02/01/2022) 18 g 0   [employer] SUNSCREEN SPF 30 EX apply topically to face and body daily for 30     No current facility-administered medications on file prior to visit.    No Known Allergies  Family History  Problem Relation Age of Onset   Coronary artery disease Other    Diabetes Other    Hyperlipidemia Other    Hypertension Other    Lung cancer Other    Stroke Other    Stroke Father    Colon polyps Father    Hypertrophic cardiomyopathy Sister     Social History   Socioeconomic History   Marital status: Married    Spouse  name: Not on file   Number of children: Not on file   Years of education: Not on file   Highest education level: Not on file  Occupational History   Not on file  Tobacco Use   Smoking status: Never   Smokeless tobacco: Never  Vaping Use   Vaping Use: Never used  Substance and Sexual Activity   Alcohol use: Yes    Alcohol/week: 0.0 standard drinks of alcohol    Comment: occ   Drug use: No   Sexual activity: Yes  Other Topics Concern   Not on file  Social History Narrative   Married   Occupation: Spanish- Recruitment consultant         Social Determinants of Corporate investment banker Strain: Not on file  Food Insecurity: Not on file  Transportation Needs: Not on file  Physical Activity: Not on file  Stress: Not on file   Social Connections: Not on file  Intimate Partner Violence: Not on file    Review of Systems  Constitutional: Negative.   HENT: Negative.    Eyes: Negative.   Respiratory: Negative.    Cardiovascular: Negative.   Gastrointestinal: Negative.   Genitourinary: Negative.   Musculoskeletal: Negative.   Skin: Negative.   Neurological: Negative.   Endo/Heme/Allergies: Negative.   Psychiatric/Behavioral: Negative.    All other systems reviewed and are negative.   BP 110/80   Pulse 60   Temp 98.2 F (36.8 C) (Oral)   Ht 5' 11.75" (1.822 m)   Wt 221 lb (100.2 kg)   SpO2 98%   BMI 30.18 kg/m   Physical Exam Vitals and nursing note reviewed.  Constitutional:      General: He is not in acute distress.    Appearance: Normal appearance. He is well-developed and normal weight.  HENT:     Head: Normocephalic and atraumatic.     Right Ear: Tympanic membrane, ear canal and external ear normal. There is no impacted cerumen.     Left Ear: Tympanic membrane, ear canal and external ear normal. There is no impacted cerumen.     Nose: Nose normal. No congestion or rhinorrhea.     Mouth/Throat:     Mouth: Mucous membranes are moist.     Pharynx: Oropharynx is clear. No oropharyngeal exudate or posterior oropharyngeal erythema.  Eyes:     General:        Right eye: No discharge.        Left eye: No discharge.     Extraocular Movements: Extraocular movements intact.     Conjunctiva/sclera: Conjunctivae normal.     Pupils: Pupils are equal, round, and reactive to light.  Neck:     Vascular: No carotid bruit.     Trachea: No tracheal deviation.  Cardiovascular:     Rate and Rhythm: Normal rate and regular rhythm.     Pulses: Normal pulses.     Heart sounds: Normal heart sounds. No murmur heard.    No friction rub. No gallop.  Pulmonary:     Effort: Pulmonary effort is normal. No respiratory distress.     Breath sounds: Normal breath sounds. No stridor. No wheezing, rhonchi or rales.   Chest:     Chest wall: No tenderness.  Abdominal:     General: Bowel sounds are normal. There is no distension.     Palpations: Abdomen is soft. There is no mass.     Tenderness: There is no abdominal tenderness. There is no right CVA tenderness, left  CVA tenderness, guarding or rebound.     Hernia: No hernia is present.  Musculoskeletal:        General: No swelling, tenderness, deformity or signs of injury. Normal range of motion.     Right lower leg: No edema.     Left lower leg: No edema.  Lymphadenopathy:     Cervical: No cervical adenopathy.  Skin:    General: Skin is warm and dry.     Capillary Refill: Capillary refill takes less than 2 seconds.     Coloration: Skin is not jaundiced or pale.     Findings: No bruising, erythema, lesion or rash.  Neurological:     General: No focal deficit present.     Mental Status: He is alert and oriented to person, place, and time.     Cranial Nerves: No cranial nerve deficit.     Sensory: No sensory deficit.     Motor: No weakness.     Coordination: Coordination normal.     Gait: Gait normal.     Deep Tendon Reflexes: Reflexes normal.  Psychiatric:        Mood and Affect: Mood normal.        Behavior: Behavior normal.        Thought Content: Thought content normal.        Judgment: Judgment normal.     Assessment/Plan: 1. Routine general medical examination at a health care facility - Continue with lifestyle modifications  - Refused colonoscopy this year  - Follow up in one year or sooner if needed - CBC with Differential/Platelet; Future - Comprehensive metabolic panel; Future - Lipid panel; Future - TSH; Future  2. Mild intermittent asthma with acute exacerbation - Albuterol PRN   3. Seasonal allergic rhinitis, unspecified trigger - Continue with Allegra   4. Essential hypertension - Mildly Hypotensive today. Possibly causing fatigue and dizziness - Will have him cut benicar in half  - CBC with Differential/Platelet;  Future - Comprehensive metabolic panel; Future - Lipid panel; Future - TSH; Future  5. Gastroesophageal reflux disease, unspecified whether esophagitis present - Continue Prevacid  - CBC with Differential/Platelet; Future - Comprehensive metabolic panel; Future - Lipid panel; Future - TSH; Future  6. Other fatigue - He would like to have his testosterone checked.  - Will have him cut benicar in half and send me BP readings at home over the next few weeks  - CBC with Differential/Platelet; Future - Comprehensive metabolic panel; Future - Lipid panel; Future - TSH; Future - Testosterone,Free and Total; Future  7. Prostate cancer screening  - PSA; Future  8. Hyperlipidemia, unspecified hyperlipidemia type - Consider statin  - CBC with Differential/Platelet; Future - Comprehensive metabolic panel; Future - Lipid panel; Future - TSH; Future - PSA; Future   Shirline Frees, NP

## 2022-02-04 ENCOUNTER — Other Ambulatory Visit (INDEPENDENT_AMBULATORY_CARE_PROVIDER_SITE_OTHER): Payer: 59

## 2022-02-04 ENCOUNTER — Encounter: Payer: Self-pay | Admitting: Adult Health

## 2022-02-04 DIAGNOSIS — Z125 Encounter for screening for malignant neoplasm of prostate: Secondary | ICD-10-CM

## 2022-02-04 DIAGNOSIS — Z0000 Encounter for general adult medical examination without abnormal findings: Secondary | ICD-10-CM

## 2022-02-04 DIAGNOSIS — K219 Gastro-esophageal reflux disease without esophagitis: Secondary | ICD-10-CM

## 2022-02-04 DIAGNOSIS — Z1159 Encounter for screening for other viral diseases: Secondary | ICD-10-CM

## 2022-02-04 DIAGNOSIS — E785 Hyperlipidemia, unspecified: Secondary | ICD-10-CM | POA: Diagnosis not present

## 2022-02-04 DIAGNOSIS — I10 Essential (primary) hypertension: Secondary | ICD-10-CM

## 2022-02-04 DIAGNOSIS — R5383 Other fatigue: Secondary | ICD-10-CM

## 2022-02-04 LAB — CBC WITH DIFFERENTIAL/PLATELET
Basophils Absolute: 0 10*3/uL (ref 0.0–0.1)
Basophils Relative: 0.7 % (ref 0.0–3.0)
Eosinophils Absolute: 0.2 10*3/uL (ref 0.0–0.7)
Eosinophils Relative: 3.5 % (ref 0.0–5.0)
HCT: 45.3 % (ref 39.0–52.0)
Hemoglobin: 15.4 g/dL (ref 13.0–17.0)
Lymphocytes Relative: 31.9 % (ref 12.0–46.0)
Lymphs Abs: 2 10*3/uL (ref 0.7–4.0)
MCHC: 34 g/dL (ref 30.0–36.0)
MCV: 90.2 fl (ref 78.0–100.0)
Monocytes Absolute: 0.4 10*3/uL (ref 0.1–1.0)
Monocytes Relative: 6.4 % (ref 3.0–12.0)
Neutro Abs: 3.7 10*3/uL (ref 1.4–7.7)
Neutrophils Relative %: 57.5 % (ref 43.0–77.0)
Platelets: 230 10*3/uL (ref 150.0–400.0)
RBC: 5.02 Mil/uL (ref 4.22–5.81)
RDW: 13.1 % (ref 11.5–15.5)
WBC: 6.4 10*3/uL (ref 4.0–10.5)

## 2022-02-04 LAB — COMPREHENSIVE METABOLIC PANEL
ALT: 26 U/L (ref 0–53)
AST: 20 U/L (ref 0–37)
Albumin: 4.6 g/dL (ref 3.5–5.2)
Alkaline Phosphatase: 88 U/L (ref 39–117)
BUN: 19 mg/dL (ref 6–23)
CO2: 22 mEq/L (ref 19–32)
Calcium: 9.4 mg/dL (ref 8.4–10.5)
Chloride: 102 mEq/L (ref 96–112)
Creatinine, Ser: 1.42 mg/dL (ref 0.40–1.50)
GFR: 58.04 mL/min — ABNORMAL LOW (ref >60.00)
Glucose, Bld: 111 mg/dL — ABNORMAL HIGH (ref 70–99)
Potassium: 4.2 mEq/L (ref 3.5–5.1)
Sodium: 136 mEq/L (ref 135–145)
Total Bilirubin: 0.7 mg/dL (ref 0.2–1.2)
Total Protein: 6.8 g/dL (ref 6.0–8.3)

## 2022-02-04 LAB — PSA: PSA: 0.22 ng/mL (ref 0.10–4.00)

## 2022-02-04 LAB — TSH: TSH: 2.23 u[IU]/mL (ref 0.35–5.50)

## 2022-02-04 LAB — LIPID PANEL
Cholesterol: 216 mg/dL — ABNORMAL HIGH (ref 0–200)
HDL: 51.1 mg/dL (ref >39.00)
LDL Cholesterol: 133 mg/dL — ABNORMAL HIGH (ref 0–99)
NonHDL: 165.2
Total CHOL/HDL Ratio: 4
Triglycerides: 162 mg/dL — ABNORMAL HIGH (ref 0.0–149.0)
VLDL: 32.4 mg/dL (ref 0.0–40.0)

## 2022-02-05 LAB — HEPATITIS C ANTIBODY: Hepatitis C Ab: NONREACTIVE

## 2022-02-10 LAB — TESTOSTERONE,FREE AND TOTAL
Testosterone, Free: 12.4 pg/mL (ref 6.8–21.5)
Testosterone: 434 ng/dL (ref 264–916)

## 2022-02-11 ENCOUNTER — Encounter: Payer: Self-pay | Admitting: Adult Health

## 2022-02-11 DIAGNOSIS — R002 Palpitations: Secondary | ICD-10-CM

## 2022-02-12 NOTE — Telephone Encounter (Signed)
Please advise 

## 2022-02-13 ENCOUNTER — Encounter: Payer: Self-pay | Admitting: Cardiology

## 2022-02-14 ENCOUNTER — Encounter: Payer: Self-pay | Admitting: Cardiology

## 2022-02-14 ENCOUNTER — Ambulatory Visit (INDEPENDENT_AMBULATORY_CARE_PROVIDER_SITE_OTHER): Payer: 59

## 2022-02-14 ENCOUNTER — Ambulatory Visit (INDEPENDENT_AMBULATORY_CARE_PROVIDER_SITE_OTHER): Payer: 59 | Admitting: Cardiology

## 2022-02-14 VITALS — BP 118/70 | HR 54 | Ht 72.0 in | Wt 220.0 lb

## 2022-02-14 DIAGNOSIS — I10 Essential (primary) hypertension: Secondary | ICD-10-CM | POA: Diagnosis not present

## 2022-02-14 DIAGNOSIS — R002 Palpitations: Secondary | ICD-10-CM

## 2022-02-14 NOTE — Progress Notes (Unsigned)
ZIO XT mailed to pt's home address. 

## 2022-02-14 NOTE — Progress Notes (Signed)
Cardiology Office Note:    Date:  02/14/2022   ID:  Derek Love, DOB Apr 16, 1971, MRN [id number]  PCP:  Shirline Frees, NP   [facility] Providers Cardiologist:  None     Referring MD: Shirline Frees, NP    History of Present Illness:    Derek Love is a 51 y.o. male here for the evaluation of palpitations at the request of Buddy Duty, NP.  Previously had an office visit with Dr. Bing Matter in 2021 for chest discomfort.  He is originally from [place] came to [place] in 51 years old.  He had experienced chest pain for years.  Typically not related to exercise.  Drinks occasional alcohol.  There is a family history of premature coronary artery disease. Father CVA 48, CABG.   He has noted palpitations for quite some time.  Can feel his heart skipping.  Drinks coffee.  Does crossfit 5 times a week.  States that this is helped him quite a bit with back pain etc.  Was taking benecar 40mg  prior to this. Felt HA, palps. Went back to 40mg  from 20 and felt better.   Has Apple watch. Coffee. Sometimes can feel a little dizzy   Past Medical History:  Diagnosis Date   ADHD (attention deficit hyperactivity disorder)    Allergic rhinitis    Asthma    GERD (gastroesophageal reflux disease)    Headache(784.0)    Hypertension    Positive PPD, treated     Past Surgical History:  Procedure Laterality Date   No Surgical History     UPPER GI ENDOSCOPY  2001    Current Medications: Current Meds  Medication Sig   albuterol (VENTOLIN HFA) 108 (90 Base) MCG/ACT inhaler Inhale 2 puffs into the lungs every 6 (six) hours as needed.   [employer] SUNSCREEN SPF 30 EX apply topically to face and body daily for 30   fexofenadine (ALLEGRA) 180 MG tablet Take 180 mg by mouth daily.   lansoprazole (PREVACID) 15 MG capsule Take 15 mg by mouth daily at 12 noon.   montelukast (SINGULAIR) 10 MG tablet Take 10 mg by mouth at bedtime.   olmesartan (BENICAR) 40 MG tablet Take 1 tablet (40 mg  total) by mouth daily.     Allergies:   Patient has no known allergies.   Social History   Socioeconomic History   Marital status: Married    Spouse name: Not on file   Number of children: Not on file   Years of education: Not on file   Highest education level: Not on file  Occupational History   Not on file  Tobacco Use   Smoking status: Never   Smokeless tobacco: Never  Vaping Use   Vaping Use: Never used  Substance and Sexual Activity   Alcohol use: Yes    Alcohol/week: 0.0 standard drinks of alcohol    Comment: occ   Drug use: No   Sexual activity: Yes  Other Topics Concern   Not on file  Social History Narrative   Not on file   Social Determinants of Health   Financial Resource Strain: Not on file  Food Insecurity: Not on file  Transportation Needs: Not on file  Physical Activity: Not on file  Stress: Not on file  Social Connections: Not on file     Family History: The patient's family history includes Colon polyps in his father; Coronary artery disease in an other family member; Diabetes in an other family member; Hyperlipidemia in an other family  member; Hypertension in an other family member; Hypertrophic cardiomyopathy in his sister; Lung cancer in an other family member; Stroke in his father and another family member.  ROS:   Please see the history of present illness.     All other systems reviewed and are negative.  EKGs/Labs/Other Studies Reviewed:    The following studies were reviewed today: Prior office notes, EKG reviewed.  LDL 133 hemoglobin A1c 5.4 hemoglobin 15.4 TSH 2.2 ALT 26 creatinine 1.4  EKG:  EKG is  ordered today.  The ekg ordered today demonstrates sinus bradycardia 54 no other abnormalities.  Early repolarization pattern.  Recent Labs: 02/04/2022: ALT 26; BUN 19; Creatinine, Ser 1.42; Hemoglobin 15.4; Platelets 230.0; Potassium 4.2; Sodium 136; TSH 2.23  Recent Lipid Panel    Component Value Date/Time   CHOL 216 (H) 02/04/2022  0743   CHOL 194 08/24/2018 1040   TRIG 162.0 (H) 02/04/2022 0743   HDL 51.10 02/04/2022 0743   HDL 44 08/24/2018 1040   CHOLHDL 4 02/04/2022 0743   VLDL 32.4 02/04/2022 0743   LDLCALC 133 (H) 02/04/2022 0743   LDLCALC 111 (H) 08/24/2018 1040   LDLDIRECT 116.0 07/23/2016 0833     Risk Assessment/Calculations:              Physical Exam:    VS:  BP 118/70   Pulse (!) 54   Ht 6' (1.829 m)   Wt 220 lb (99.8 kg)   SpO2 99%   BMI 29.84 kg/m     Wt Readings from Last 3 Encounters:  02/14/22 220 lb (99.8 kg)  02/01/22 221 lb (100.2 kg)  01/04/22 222 lb (100.7 kg)     GEN:  Well nourished, well developed in no acute distress HEENT: Normal NECK: No JVD; No carotid bruits LYMPHATICS: No lymphadenopathy CARDIAC: RRR, no murmurs, no rubs, gallops RESPIRATORY:  Clear to auscultation without rales, wheezing or rhonchi  ABDOMEN: Soft, non-tender, non-distended MUSCULOSKELETAL:  No edema; No deformity  SKIN: Warm and dry NEUROLOGIC:  Alert and oriented x 3 PSYCHIATRIC:  Normal affect   ASSESSMENT:    1. PALPITATIONS, RECURRENT   2. Primary hypertension    PLAN:    In order of problems listed above:  Palpitations - We will go ahead and check a Zio patch monitor.  Could be PVCs or PACs.  Described to him.  Benign.  Certainly caffeine or other stimulants can increase overall PVC amounts.  No high risk symptoms such as syncope.  Hypertension - Currently on olmesartan 40 mg once a day.  Doing well.  Previously had tried to cut this back to 20 mg but felt worse.         Medication Adjustments/Labs and Tests Ordered: Current medicines are reviewed at length with the patient today.  Concerns regarding medicines are outlined above.  Orders Placed This Encounter  Procedures   LONG TERM MONITOR (3-14 DAYS)   EKG 12-Lead   No orders of the defined types were placed in this encounter.   Patient Instructions  Medication Instructions:  The current medical regimen is  effective;  continue present plan and medications.  *If you need a refill on your cardiac medications before your next appointment, please call your pharmacy*  Testing/Procedures: ZIO XT- Long Term Monitor Instructions  Your physician has requested you wear a ZIO patch monitor for 14 days.  This is a single patch monitor. [employer] supplies one patch monitor per enrollment. Additional stickers are not available. Please do not apply patch if you  will be having a Nuclear Stress Test,  Echocardiogram, Cardiac CT, MRI, or Chest Xray during the period you would be wearing the  monitor. The patch cannot be worn during these tests. You cannot remove and re-apply the  ZIO XT patch monitor.  Your ZIO patch monitor will be mailed 3 day USPS to your address on file. It may take 3-5 days  to receive your monitor after you have been enrolled.  Once you have received your monitor, please review the enclosed instructions. Your monitor  has already been registered assigning a specific monitor serial # to you.  Billing and Patient Assistance Program Information  We have supplied [employer] with any of your insurance information on file for billing purposes. [employer] offers a sliding scale Patient Assistance Program for patients that do not have  insurance, or whose insurance does not completely cover the cost of the ZIO monitor.  You must apply for the Patient Assistance Program to qualify for this discounted rate.  To apply, please call [employer] at [phone], select option 4, select option 2, ask to apply for  Patient Assistance Program. Meredeth Ide will ask your household income, and how many people  are in your household. They will quote your out-of-pocket cost based on that information.  [employer] will also be able to set up a 21-month, interest-free payment plan if needed.  Applying the monitor   Shave hair from upper left chest.  Hold abrader disc by orange tab. Rub abrader in 40 strokes over the upper  left chest as  indicated in your monitor instructions.  Clean area with 4 enclosed alcohol pads. Let dry.  Apply patch as indicated in monitor instructions. Patch will be placed under collarbone on left  side of chest with arrow pointing upward.  Rub patch adhesive wings for 2 minutes. Remove white label marked "1". Remove the white  label marked "2". Rub patch adhesive wings for 2 additional minutes.  While looking in a mirror, press and release button in center of patch. A small green light will  flash 3-4 times. This will be your only indicator that the monitor has been turned on.  Do not shower for the first 24 hours. You may shower after the first 24 hours.  Press the button if you feel a symptom. You will hear a small click. Record Date, Time and  Symptom in the Patient Logbook.  When you are ready to remove the patch, follow instructions on the last 2 pages of Patient  Logbook. Stick patch monitor onto the last page of Patient Logbook.  Place Patient Logbook in the blue and white box. Use locking tab on box and tape box closed  securely. The blue and white box has prepaid postage on it. Please place it in the mailbox as  soon as possible. Your physician should have your test results approximately 7 days after the  monitor has been mailed back to [facility].  Call [facility] Customer Care at [phone] if you have questions regarding  your ZIO XT patch monitor. Call them immediately if you see an orange light blinking on your  monitor.  If your monitor falls off in less than 4 days, contact our Monitor department at [phone].  If your monitor becomes loose or falls off after 4 days call [employer] at [phone] for  suggestions on securing your monitor  Follow-Up: At [facility], you and your health needs are our priority.  As part of our continuing mission to provide you with exceptional heart  care, we have created designated Provider Care Teams.  These Care Teams  include your primary Cardiologist (physician) and Advanced Practice Providers (APPs -  Physician Assistants and Nurse Practitioners) who all work together to provide you with the care you need, when you need it.  We recommend signing up for the patient portal called "MyChart".  Sign up information is provided on this After Visit Summary.  MyChart is used to connect with patients for Virtual Visits (Telemedicine).  Patients are able to view lab/test results, encounter notes, upcoming appointments, etc.  Non-urgent messages can be sent to your provider as well.   To learn more about what you can do with MyChart, go to [email or web address].    Your next appointment:   Follow up will be based on the results of the above testing.   Important Information About Sugar         Signed, Donato Schultz, MD  02/14/2022 10:54 AM    [facility]

## 2022-02-14 NOTE — Patient Instructions (Signed)
Medication Instructions:  The current medical regimen is effective;  continue present plan and medications.  *If you need a refill on your cardiac medications before your next appointment, please call your pharmacy*  Testing/Procedures: [place] Monitor Instructions  Your physician has requested you wear a ZIO patch monitor for 14 days.  This is a single patch monitor. [employer] supplies one patch monitor per enrollment. Additional stickers are not available. Please do not apply patch if you will be having a Nuclear Stress Test,  Echocardiogram, Cardiac CT, MRI, or Chest Xray during the period you would be wearing the  monitor. The patch cannot be worn during these tests. You cannot remove and re-apply the  ZIO XT patch monitor.  Your ZIO patch monitor will be mailed 3 day USPS to your address on file. It may take 3-5 days  to receive your monitor after you have been enrolled.  Once you have received your monitor, please review the enclosed instructions. Your monitor  has already been registered assigning a specific monitor serial # to you.  Billing and Patient Assistance Program Information  We have supplied [employer] with any of your insurance information on file for billing purposes. [employer] offers a sliding scale Patient Assistance Program for patients that do not have  insurance, or whose insurance does not completely cover the cost of the ZIO monitor.  You must apply for the Patient Assistance Program to qualify for this discounted rate.  To apply, please call [employer] at [phone], select option 4, select option 2, ask to apply for  Patient Assistance Program. Theodore Demark will ask your household income, and how many people  are in your household. They will quote your out-of-pocket cost based on that information.  [employer] will also be able to set up a 24-month interest-free payment plan if needed.  Applying the monitor   Shave hair from upper left chest.  Hold abrader disc  by orange tab. Rub abrader in 40 strokes over the upper left chest as  indicated in your monitor instructions.  Clean area with 4 enclosed alcohol pads. Let dry.  Apply patch as indicated in monitor instructions. Patch will be placed under collarbone on left  side of chest with arrow pointing upward.  Rub patch adhesive wings for 2 minutes. Remove white label marked "1". Remove the white  label marked "2". Rub patch adhesive wings for 2 additional minutes.  While looking in a mirror, press and release button in center of patch. A small green light will  flash 3-4 times. This will be your only indicator that the monitor has been turned on.  Do not shower for the first 24 hours. You may shower after the first 24 hours.  Press the button if you feel a symptom. You will hear a small click. Record Date, Time and  Symptom in the Patient Logbook.  When you are ready to remove the patch, follow instructions on the last 2 pages of Patient  Logbook. Stick patch monitor onto the last page of Patient Logbook.  Place Patient Logbook in the blue and white box. Use locking tab on box and tape box closed  securely. The blue and white box has prepaid postage on it. Please place it in the mailbox as  soon as possible. Your physician should have your test results approximately 7 days after the  monitor has been mailed back to [facility]  Call [facility] [phone] you have questions regarding  your ZIO XT patch  monitor. Call them immediately if you see an orange light blinking on your  monitor.  If your monitor falls off in less than 4 days, contact our Monitor department at [phone].  If your monitor becomes loose or falls off after 4 days call [employer] at [phone] for  suggestions on securing your monitor  Follow-Up: At [facility], you and your health needs are our priority.  As part of our continuing mission to provide you with exceptional heart care, we have  created designated Provider Care Teams.  These Care Teams include your primary Cardiologist (physician) and Advanced Practice Providers (APPs -  Physician Assistants and Nurse Practitioners) who all work together to provide you with the care you need, when you need it.  We recommend signing up for the patient portal called "MyChart".  Sign up information is provided on this After Visit Summary.  MyChart is used to connect with patients for Virtual Visits (Telemedicine).  Patients are able to view lab/test results, encounter notes, upcoming appointments, etc.  Non-urgent messages can be sent to your provider as well.   To learn more about what you can do with MyChart, go to [email or web address].    Your next appointment:   Follow up will be based on the results of the above testing.   Important Information About Sugar

## 2022-02-16 ENCOUNTER — Other Ambulatory Visit: Payer: Self-pay | Admitting: Medical

## 2022-02-19 DIAGNOSIS — R002 Palpitations: Secondary | ICD-10-CM | POA: Diagnosis not present

## 2022-03-05 ENCOUNTER — Encounter: Payer: Self-pay | Admitting: Cardiology

## 2022-03-06 ENCOUNTER — Ambulatory Visit: Payer: 59 | Admitting: Cardiology

## 2022-04-21 ENCOUNTER — Ambulatory Visit
Admission: RE | Admit: 2022-04-21 | Discharge: 2022-04-21 | Disposition: A | Discharge status: Home / Self Care | Payer: 59 | Source: Ambulatory Visit

## 2022-04-21 ENCOUNTER — Other Ambulatory Visit: Payer: Self-pay

## 2022-04-21 VITALS — BP 122/84 | HR 59 | Temp 98.5°F | Resp 16

## 2022-04-21 DIAGNOSIS — R21 Rash and other nonspecific skin eruption: Secondary | ICD-10-CM

## 2022-04-21 MED ORDER — CEPHALEXIN 500 MG PO CAPS: 500.0000 mg | ORAL_CAPSULE | Freq: Three times a day (TID) | ORAL | 0 refills | Status: AC

## 2022-04-21 MED ORDER — PREDNISONE 20 MG PO TABS: ORAL_TABLET | ORAL | 0 refills | Status: DC

## 2022-04-21 NOTE — ED Provider Notes (Signed)
[facility]    CSN: [id number] Arrival date & time: 04/21/22  5035      History   Chief Complaint Chief Complaint  Patient presents with   Eye Problem    Left    HPI Derek Love is a 51 y.o. male.   HPI 51 year old male presents with bumps of the lower eyelid for 1 day.  Reports some redness and swelling of the left eye.  Reports was in [place] yesterday and applied triple antibiotic to affected area of left lower eyelid.  PMH significant for ADHD, HTN, and obesity.  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 Authorizing Provider  cephALEXin (KEFLEX) 500 MG capsule Take 1 capsule (500 mg total) by mouth 3 (three) times daily for 7 days. 04/21/22 04/28/22 Yes Trevor Iha, FNP  cetirizine (ZYRTEC) 10 MG tablet Take 10 mg by mouth daily.   Yes [provider]  lansoprazole (PREVACID) 15 MG capsule Take 15 mg by mouth daily at 12 noon.   Yes [provider]  olmesartan (BENICAR) 40 MG tablet TAKE ONE TABLET BY MOUTH ONE TIME DAILY 02/19/22   Yes Nafziger, Kandee Keen, NP  predniSONE (DELTASONE) 20 MG tablet Take 3 tabs PO daily x 5 days. 04/21/22  Yes Trevor Iha, FNP  albuterol (VENTOLIN HFA) 108 (90 Base) MCG/ACT inhaler Inhale 2 puffs into the lungs every 6 (six) hours as needed. 11/08/19   Saguier, Ramon Dredge, PA-C  [employer] SUNSCREEN SPF 30 EX apply topically to face and body daily for 30    [provider]  fexofenadine (ALLEGRA) 180 MG tablet Take 180 mg by mouth daily.    [provider]  montelukast (SINGULAIR) 10 MG tablet Take 10 mg by mouth at bedtime.    [provider]    Family History Family History  Problem Relation Age of Onset   Coronary artery disease Other    Diabetes Other    Hyperlipidemia Other    Hypertension Other    Lung cancer Other    Stroke Other    Stroke Father    Colon polyps Father    Hypertrophic cardiomyopathy Sister     Social History Social History   Tobacco Use   Smoking status: Never   Smokeless tobacco: Never  Vaping Use   Vaping Use: Never used  Substance Use Topics   Alcohol use: Yes    Alcohol/week: 0.0 standard drinks of alcohol    Comment: occ   Drug use: No  Allergies   Patient has no known allergies.   Review of Systems Review of Systems  Eyes:  Positive for pain.  All other systems reviewed and are negative.    Physical Exam Triage Vital Signs ED Triage Vitals  Enc Vitals Group     BP      Pulse      Resp      Temp      Temp src      SpO2      Weight      Height      Head Circumference      Peak Flow      Pain Score      Pain Loc      Pain Edu?      Excl. in [place]?    No data found.  Updated Vital Signs BP 122/84 (BP Location: Right Arm)   Pulse (!) 59   Temp 98.5 F (36.9 C) (Oral)   Resp 16   SpO2 97%    Physical Exam Vitals and nursing note reviewed.  Constitutional:      Appearance: Normal appearance. He is obese.  HENT:     Head: Normocephalic and atraumatic.     Mouth/Throat:     Mouth: Mucous  membranes are moist.     Pharynx: Oropharynx is clear.  Eyes:     Extraocular Movements: Extraocular movements intact.     Conjunctiva/sclera: Conjunctivae normal.     Pupils: Pupils are equal, round, and reactive to light.  Cardiovascular:     Rate and Rhythm: Normal rate and regular rhythm.     Pulses: Normal pulses.     Heart sounds: Normal heart sounds. No murmur heard. Pulmonary:     Effort: Pulmonary effort is normal.     Breath sounds: Normal breath sounds. No wheezing, rhonchi or rales.  Musculoskeletal:        General: Normal range of motion.     Cervical back: Normal range of motion and neck supple.  Skin:    General: Skin is warm.     Comments: Left inner canthus/inferior orbit area: Erythematous maculopapular eruption noted-please see image inserted below  Neurological:     General: No focal deficit present.     Mental Status: He is alert and oriented to person, place, and time.       UC Treatments / Results  Labs (all labs ordered are listed, but only abnormal results are displayed) Labs Reviewed - No data to display  EKG   Radiology No results found.  Procedures Procedures (including critical care time)  Medications Ordered in UC Medications - No data to display  Initial Impression / Assessment and Plan / UC Course  I have reviewed the triage vital signs and the nursing notes.  Pertinent labs & imaging results that were available during my care of the patient were reviewed by me and considered in my medical decision making (see chart for details).     MDM: 1.  Rash and nonspecific skin eruption-Rx'd prednisone, Keflex. Advised patient to take medication as directed with food to completion.  Advised patient to take prednisone with first dose of Keflex for the next 5 of 7 days.  Encouraged patient to increase daily water intake to 64 ounces per day while taking these medications.  Advised if symptoms worsen and/or unresolved please follow-up with your  optometrist/ophthalmologist for further evaluation.  Final Clinical Impressions(s) / UC Diagnoses   Final diagnoses:  Rash and nonspecific skin eruption  Discharge Instructions      Advised patient to take medication as directed with food to completion.  Advised patient to take prednisone with first dose of Keflex for the next 5 of 7 days.  Encouraged patient to increase daily water intake to 64 ounces per day while taking these medications.  Advised if symptoms worsen and/or unresolved please follow-up with your optometrist/ophthalmologist for further evaluation.     ED Prescriptions     Medication Sig Dispense Auth. Provider   cephALEXin (KEFLEX) 500 MG capsule Take 1 capsule (500 mg total) by mouth 3 (three) times daily for 7 days. 21 capsule Trevor Iha, FNP   predniSONE (DELTASONE) 20 MG tablet Take 3 tabs PO daily x 5 days. 15 tablet Trevor Iha, FNP      PDMP not reviewed this encounter.   Trevor Iha, FNP 04/21/22 1029

## 2022-04-21 NOTE — ED Triage Notes (Signed)
Patient presents to Urgent Care with complaints of bumps at the lower eye lid since 1 day ago. Patient reports some redness, swelling of the left eye. Denies any changes of vision in left eye. Did come from [place] yesterday. Applied triple antibiotic on the lower lid caused burning just using water to rinse the area

## 2022-04-21 NOTE — Discharge Instructions (Addendum)
Advised patient to take medication as directed with food to completion.  Advised patient to take prednisone with first dose of Keflex for the next 5 of 7 days.  Encouraged patient to increase daily water intake to 64 ounces per day while taking these medications.  Advised if symptoms worsen and/or unresolved please follow-up with your optometrist/ophthalmologist for further evaluation.

## 2022-04-22 ENCOUNTER — Telehealth: Payer: Self-pay | Admitting: Emergency Medicine

## 2022-04-22 NOTE — Telephone Encounter (Signed)
LMTRC.  Advised if doing well to disregard the call.  Any questions or concerns feel free to give the office a call back. 

## 2022-05-06 ENCOUNTER — Encounter: Payer: Self-pay | Admitting: Adult Health

## 2022-05-06 ENCOUNTER — Other Ambulatory Visit: Payer: Self-pay

## 2022-05-06 ENCOUNTER — Other Ambulatory Visit (INDEPENDENT_AMBULATORY_CARE_PROVIDER_SITE_OTHER): Payer: 59

## 2022-05-06 DIAGNOSIS — E785 Hyperlipidemia, unspecified: Secondary | ICD-10-CM

## 2022-05-06 DIAGNOSIS — I10 Essential (primary) hypertension: Secondary | ICD-10-CM | POA: Diagnosis not present

## 2022-05-06 LAB — COMPREHENSIVE METABOLIC PANEL
ALT: 31 U/L (ref 0–53)
AST: 20 U/L (ref 0–37)
Albumin: 4.6 g/dL (ref 3.5–5.2)
Alkaline Phosphatase: 73 U/L (ref 39–117)
BUN: 17 mg/dL (ref 6–23)
CO2: 30 mEq/L (ref 19–32)
Calcium: 9.6 mg/dL (ref 8.4–10.5)
Chloride: 100 mEq/L (ref 96–112)
Creatinine, Ser: 1.05 mg/dL (ref 0.40–1.50)
GFR: 83.24 mL/min (ref >60.00)
Glucose, Bld: 92 mg/dL (ref 70–99)
Potassium: 4.4 mEq/L (ref 3.5–5.1)
Sodium: 137 mEq/L (ref 135–145)
Total Bilirubin: 0.7 mg/dL (ref 0.2–1.2)
Total Protein: 6.7 g/dL (ref 6.0–8.3)

## 2022-05-06 LAB — LIPID PANEL
Cholesterol: 249 mg/dL — ABNORMAL HIGH (ref 0–200)
HDL: 57.3 mg/dL (ref >39.00)
NonHDL: 191.61
Total CHOL/HDL Ratio: 4
Triglycerides: 279 mg/dL — ABNORMAL HIGH (ref 0.0–149.0)
VLDL: 55.8 mg/dL — ABNORMAL HIGH (ref 0.0–40.0)

## 2022-05-06 LAB — LDL CHOLESTEROL, DIRECT: Direct LDL: 148 mg/dL

## 2022-05-07 NOTE — Telephone Encounter (Signed)
FYI

## 2022-06-05 ENCOUNTER — Encounter: Payer: Self-pay | Admitting: Adult Health

## 2022-06-05 NOTE — Telephone Encounter (Signed)
Please advise 

## 2022-06-05 NOTE — Telephone Encounter (Signed)
Spoke to pt and he stated that he made an appointment for Friday. No further actions needed.

## 2022-06-07 ENCOUNTER — Ambulatory Visit: Payer: 59 | Admitting: Adult Health

## 2022-06-08 ENCOUNTER — Other Ambulatory Visit: Payer: Self-pay | Admitting: Medical

## 2022-06-15 ENCOUNTER — Other Ambulatory Visit: Payer: Self-pay | Admitting: Medical

## 2022-07-22 ENCOUNTER — Other Ambulatory Visit: Payer: Self-pay | Admitting: Medical

## 2022-07-31 ENCOUNTER — Encounter (INDEPENDENT_AMBULATORY_CARE_PROVIDER_SITE_OTHER): Payer: 59 | Admitting: Cardiology

## 2022-07-31 DIAGNOSIS — R002 Palpitations: Secondary | ICD-10-CM | POA: Diagnosis not present

## 2022-08-01 ENCOUNTER — Other Ambulatory Visit: Payer: Self-pay | Admitting: *Deleted

## 2022-08-01 DIAGNOSIS — R9431 Abnormal electrocardiogram [ECG] [EKG]: Secondary | ICD-10-CM

## 2022-08-01 DIAGNOSIS — R002 Palpitations: Secondary | ICD-10-CM

## 2022-08-01 NOTE — Telephone Encounter (Signed)
Please see the MyChart message reply(ies) for my assessment and plan.    This patient gave consent for this Medical Advice Message and is aware that it may result in a bill to [employer], as well as the possibility of receiving a bill for a co-payment or deductible. They are an established patient, but are not seeking medical advice exclusively about a problem treated during an in person or video visit in the last seven days. I did not recommend an in person or video visit within seven days of my reply.    I spent a total of 5 minutes cumulative time within 7 days through [employer].  Echocardiogram-diagnosis abnormal EKG.  Looking for hypertrophic cardiomyopathy.  His brother has HOCM  Candee Furbish, MD

## 2022-08-01 NOTE — Progress Notes (Signed)
Thank you for letting us know about your brothers hypertrophic cardiomyopathy diagnosis.  I would like to go ahead and order an echocardiogram for you.  This will show [place] your structure and function of your heart.  My team will be in touch.  Thank you.   Sincerely,  Candee Furbish, MD

## 2022-08-02 ENCOUNTER — Telehealth: Payer: Self-pay

## 2022-08-02 ENCOUNTER — Encounter: Payer: Self-pay | Admitting: Adult Health

## 2022-08-02 NOTE — Telephone Encounter (Signed)
Note send to [employer]

## 2022-08-02 NOTE — Telephone Encounter (Signed)
FYI ok for note

## 2022-08-02 NOTE — Telephone Encounter (Signed)
Erroneous encounter

## 2022-08-28 ENCOUNTER — Ambulatory Visit (HOSPITAL_COMMUNITY): Payer: 59 | Attending: Cardiology

## 2022-08-28 DIAGNOSIS — R9431 Abnormal electrocardiogram [ECG] [EKG]: Secondary | ICD-10-CM

## 2022-08-28 DIAGNOSIS — R002 Palpitations: Secondary | ICD-10-CM

## 2022-08-28 LAB — ECHOCARDIOGRAM COMPLETE
Area-P 1/2: 3.46 cm2
S' Lateral: 3.1 cm

## 2022-10-01 ENCOUNTER — Encounter: Payer: Self-pay | Admitting: Adult Health

## 2022-10-01 ENCOUNTER — Ambulatory Visit (INDEPENDENT_AMBULATORY_CARE_PROVIDER_SITE_OTHER): Payer: 59 | Admitting: Adult Health

## 2022-10-01 VITALS — BP 110/70 | HR 87 | Temp 97.7°F | Ht 72.0 in | Wt 226.0 lb

## 2022-10-01 DIAGNOSIS — J302 Other seasonal allergic rhinitis: Secondary | ICD-10-CM

## 2022-10-01 MED ORDER — METHYLPREDNISOLONE ACETATE 80 MG/ML IJ SUSP
80.0000 mg | Freq: Once | INTRAMUSCULAR | Status: AC
Start: 2022-10-01 — End: 2022-10-01
  Administered 2022-10-01: 80 mg via INTRAMUSCULAR

## 2022-10-01 MED ORDER — AZELASTINE HCL 0.1 % NA SOLN: 2.0000 | Freq: Two times a day (BID) | NASAL | 3 refills | Status: DC

## 2022-10-01 MED ORDER — METHYLPREDNISOLONE ACETATE 40 MG/ML IJ SUSP
40.0000 mg | Freq: Once | INTRAMUSCULAR | Status: AC
Start: 2022-10-01 — End: 2022-10-01
  Administered 2022-10-01: 40 mg via INTRAMUSCULAR

## 2022-10-01 NOTE — Patient Instructions (Signed)
It was great seeing you today   We gave you a shot of depo medrol today in the office to help with your symptoms   I have also referred you to the allergy clinic

## 2022-10-01 NOTE — Progress Notes (Signed)
Subjective:    Patient ID: Derek Love, male    DOB: 06/26/1971, 52 y.o.   MRN: [id number]  Sinusitis   52 year old male who  has a past medical history of ADHD (attention deficit hyperactivity disorder), Allergic rhinitis, Asthma, GERD (gastroesophageal reflux disease), Headache(784.0), Hypertension, and Positive PPD, treated.  He presents to the office today for seasonal allergies. He is currently using Singulair and Allegra,  zyrtec. Feels like his allergies " get worse every year". Currently not controlled. He has had itchy watery eyes, sore throat, sinus congestion.    Review of Systems See HPI   Past Medical History:  Diagnosis Date   ADHD (attention deficit hyperactivity disorder)    Allergic rhinitis    Asthma    GERD (gastroesophageal reflux disease)    Headache(784.0)    Hypertension    Positive PPD, treated     Social History   Socioeconomic History   Marital status: Married    Spouse name: Not on file   Number of children: Not on file   Years of education: Not on file   Highest education level: Not on file  Occupational History   Not on file  Tobacco Use   Smoking status: Never   Smokeless tobacco: Never  Vaping Use   Vaping Use: Never used  Substance and Sexual Activity   Alcohol use: Yes    Alcohol/week: 0.0 standard drinks of alcohol    Comment: occ   Drug use: No   Sexual activity: Yes  Other Topics Concern   Not on file  Social History Narrative   Not on file   Social Determinants of Health   Financial Resource Strain: Not on file  Food Insecurity: Not on file  Transportation Needs: Not on file  Physical Activity: Not on file  Stress: Not on file  Social Connections: Not on file  Intimate Partner Violence: Not on file    Past Surgical History:  Procedure Laterality Date   No Surgical History     UPPER GI ENDOSCOPY  2001    Family History  Problem Relation Age of Onset   Coronary artery disease Other    Diabetes Other     Hyperlipidemia Other    Hypertension Other    Lung cancer Other    Stroke Other    Stroke Father    Colon polyps Father    Hypertrophic cardiomyopathy Sister     No Known Allergies  Current Outpatient Medications on File Prior to Visit  Medication Sig Dispense Refill   [employer] SUNSCREEN SPF 30 EX apply topically to face and body daily for 30     montelukast (SINGULAIR) 10 MG tablet TAKE 1 TABLET BY MOUTH AT BEDTIME 30 tablet 0   olmesartan (BENICAR) 40 MG tablet TAKE ONE TABLET BY MOUTH ONE TIME DAILY 90 tablet 3   No current facility-administered medications on file prior to visit.    BP 110/70   Pulse 87   Temp 97.7 F (36.5 C) (Oral)   Ht 6' (1.829 m)   Wt 226 lb (102.5 kg)   PF 98 L/min   BMI 30.65 kg/m       Objective:   Physical Exam Vitals and nursing note reviewed.  Constitutional:      Appearance: Normal appearance.  HENT:     Nose: Congestion present. No rhinorrhea.     Right Turbinates: Not enlarged or swollen.     Left Turbinates: Not enlarged or swollen.  Cardiovascular:  Rate and Rhythm: Normal rate and regular rhythm.     Pulses: Normal pulses.     Heart sounds: Normal heart sounds.  Pulmonary:     Effort: Pulmonary effort is normal.     Breath sounds: Normal breath sounds.  Skin:    General: Skin is warm and dry.  Neurological:     General: No focal deficit present.     Mental Status: He is alert and oriented to person, place, and time.  Psychiatric:        Mood and Affect: Mood normal.        Behavior: Behavior normal.        Thought Content: Thought content normal.       Assessment & Plan:  1. Seasonal allergies - Will give depo medrol injection today. Send in Astelin nasal spray and refer to allergy and asthma.  - azelastine (ASTELIN) 0.1 % nasal spray; Place 2 sprays into both nostrils 2 (two) times daily. Use in each nostril as directed  Dispense: 30 mL; Refill: 3 - Ambulatory referral to Allergy - methylPREDNISolone acetate  (DEPO-MEDROL) injection 80 mg - methylPREDNISolone acetate (DEPO-MEDROL) injection 40 mg  Dorothyann Peng, NP

## 2022-10-12 ENCOUNTER — Other Ambulatory Visit: Payer: Self-pay | Admitting: Medical

## 2022-10-30 ENCOUNTER — Ambulatory Visit (INDEPENDENT_AMBULATORY_CARE_PROVIDER_SITE_OTHER): Payer: Managed Care, Other (non HMO) | Admitting: Allergy

## 2022-10-30 ENCOUNTER — Telehealth: Payer: Self-pay | Admitting: Allergy

## 2022-10-30 ENCOUNTER — Other Ambulatory Visit: Payer: Self-pay

## 2022-10-30 ENCOUNTER — Encounter: Payer: Self-pay | Admitting: Allergy

## 2022-10-30 ENCOUNTER — Telehealth: Payer: Self-pay

## 2022-10-30 VITALS — BP 124/86 | HR 70 | Temp 98.4°F | Resp 18 | Wt 227.6 lb

## 2022-10-30 DIAGNOSIS — L500 Allergic urticaria: Secondary | ICD-10-CM

## 2022-10-30 DIAGNOSIS — J4520 Mild intermittent asthma, uncomplicated: Secondary | ICD-10-CM | POA: Diagnosis not present

## 2022-10-30 DIAGNOSIS — J3089 Other allergic rhinitis: Secondary | ICD-10-CM | POA: Diagnosis not present

## 2022-10-30 DIAGNOSIS — H1013 Acute atopic conjunctivitis, bilateral: Secondary | ICD-10-CM

## 2022-10-30 MED ORDER — RYALTRIS 665-25 MCG/ACT NA SUSP: 2.0000 | Freq: Two times a day (BID) | NASAL | 5 refills | Status: DC | PRN

## 2022-10-30 MED ORDER — METHYLPREDNISOLONE ACETATE 80 MG/ML IJ SUSP
80.0000 mg | Freq: Once | INTRAMUSCULAR | Status: AC
  Administered 2022-10-30: 80 mg via INTRAMUSCULAR

## 2022-10-30 MED ORDER — RYVENT 6 MG PO TABS: 6.0000 mg | ORAL_TABLET | Freq: Two times a day (BID) | ORAL | 5 refills | Status: DC

## 2022-10-30 MED ORDER — LASTACAFT 0.25 % OP SOLN: 1.0000 [drp] | Freq: Every day | OPHTHALMIC | 5 refills | Status: DC | PRN

## 2022-10-30 MED ORDER — MONTELUKAST SODIUM 10 MG PO TABS: 10.0000 mg | ORAL_TABLET | Freq: Every day | ORAL | 1 refills | Status: DC

## 2022-10-30 NOTE — Progress Notes (Signed)
New Patient Note  RE: Derek Love MRN: [id number] DOB: [birth year] Date of Office Visit: 10/30/2022 Primary care provider: Shirline Frees, NP  Chief Complaint: allergies  History of present illness: Derek Love is a 53 y.o. male presenting today for evaluation of allergic rhinitis and asthma.   He states every year his allergy symptoms seem to be getting worse and worse.  He reports symptoms of itchy/watery eyes, runny/stuffy nose, sneezing, throat clearing, itchy ears, itchy throat. His asthma flares during this time of year only.   He has itchy spots on the skin as well that turn red when he scratches.  He states his quality of life is really bad due to allergies.  He does not want to be dependent on medications to control his allergies for forever.  Zyrtec works the best but makes him drowsy.  Allegra doesn't make him drowsy but is not as effective as Zyrtec.  Claritin and Xyzal didn't help.  He has taken Singulair daily for past 3-5 years.  He has also used flonase but states make his voice change.  He has tried azelastine and states he had voice change too as well as heart racing.  Zaditor and [employer] work well for eye symptoms. Lastacaft has worked the best.  He states Afrin works the best for nasal symptoms but he knows can't use this consistently.  He had depomedrol injection which works well for his symptoms but is temporary relief.  He states he had a steroid injection about a month ago that has already worn off. He has an albuterol inhaler but has infrequent use but the time of year he may need to use it in is the spring.  No history for eczema and food allergy.   He has seen allergist at [facility] allergy last in 2018 with skin testing positive for tree pollen, weed pollen, grass pollen, dust mites, mold and negative for foods. He did not do immunotherapy at that time.    Review of systems: Review of Systems  Constitutional: Negative.   HENT:  Positive for  congestion, postnasal drip and sneezing.   Eyes:  Positive for itching.  Respiratory:  Positive for cough.   Cardiovascular: Negative.   Musculoskeletal: Negative.   Skin: Negative.   Allergic/Immunologic: Negative.   Neurological: Negative.     All other systems negative unless noted above in HPI  Past medical history: Past Medical History:  Diagnosis Date   ADHD (attention deficit hyperactivity disorder)    Allergic rhinitis    Asthma    Eczema    GERD (gastroesophageal reflux disease)    Headache(784.0)    Hypertension    Positive PPD, treated     Past surgical history: Past Surgical History:  Procedure Laterality Date   No Surgical History     UPPER GI ENDOSCOPY  2001    Family history:  Family History  Problem Relation Age of Onset   Stroke Father    Colon polyps Father    Hypertrophic cardiomyopathy Sister    Allergic rhinitis Maternal Grandmother    Coronary artery disease Other    Diabetes Other    Hyperlipidemia Other    Hypertension Other    Lung cancer Other    Stroke Other     Social history: Lives in an townhome without carpeting with gas heating and central cooling.  Dog in the home/bedroom.  No concern for water damage, mildew or roaches in the home. Does not report occupation at this time.  Denies  smoking history.    Medication List: Current Outpatient Medications  Medication Sig Dispense Refill   Alcaftadine (LASTACAFT) 0.25 % SOLN Apply 1 drop to eye daily as needed (itchy/watery eyes). 5 mL 5   Carbinoxamine Maleate (RYVENT) 6 MG TABS Take 1 tablet (6 mg total) by mouth in the morning and at bedtime. 120 tablet 5   olmesartan (BENICAR) 40 MG tablet TAKE ONE TABLET BY MOUTH ONE TIME DAILY 90 tablet 3   azelastine (ASTELIN) 0.1 % nasal spray Place 2 sprays into both nostrils 2 (two) times daily. Use in each nostril as directed (Patient not taking: Reported on 10/30/2022) 30 mL 3   [employer] SUNSCREEN SPF 30 EX apply topically to face and body daily  for 30 (Patient not taking: Reported on 10/30/2022)     montelukast (SINGULAIR) 10 MG tablet Take 1 tablet (10 mg total) by mouth at bedtime. 90 tablet 1   No current facility-administered medications for this visit.    Known medication allergies: No Known Allergies   Physical examination: Blood pressure 124/86, pulse 70, temperature 98.4 F (36.9 C), temperature source Temporal, resp. rate 18, weight 227 lb 9.6 oz (103.2 kg), SpO2 95 %.  General: Alert, interactive, in no acute distress. HEENT: PERRLA, TMs pearly gray, turbinates markedly edematous with clear discharge, post-pharynx non erythematous. Neck: Supple without lymphadenopathy. Lungs: Clear to auscultation without wheezing, rhonchi or rales. {no increased work of breathing. CV: Normal S1, S2 without murmurs. Abdomen: Nondistended, nontender. Skin: Warm and dry, without lesions or rashes. Extremities:  No clubbing, cyanosis or edema. Neuro:   Grossly intact.  Diagnositics/Labs:  Spirometry: FEV1: 3.98L 102%, FVC: 4.93L 100%, ratio consistent with nonobstructive pattern  Allergy testing: unable to perform due to recent antihistamine use   Assessment and plan: Allergic rhinitis with conjunctivitis  - Will obtain serum IgE levels for environmental panel (antihistamines do not interfere with this testing) - Previous skin testing from 2018 positive to tree pollen, weed pollen, grass pollen, dust mites, mold.    Continue avoidance measures for these allergens. - Stop taking: Zyrtec for now while we try a different antihistamine.   - Continue with: Singulair (montelukast) 10mg  daily - Start taking: Ryvent (carbinoxamine) 6mg  tablet 2 times daily.  This is an antihistamine.  Ryaltris (olopatadine/mometasone) two sprays per nostril 2 times daily for congestion and/or drainage.  This is a combination nasal spray.   Lastacaft 1 drop each eye up to twice a day as needed for itchy/watery eyes.   - Perform nasal saline rinses daily  to remove allergens from the nasal cavities as well as help with mucous clearance (this is especially helpful to do before the nasal sprays are given) - Recommend starting allergy shots as a means of long-term control.   Allergy shots "re-train" and "reset" the immune system to ignore environmental allergens and decrease the resulting immune response to those allergens (sneezing, itchy watery eyes, runny nose, nasal congestion, etc).   Allergy shots improve symptoms in ~85% of patients.    We have discussed both traditional build-up option and RUSH build-up option and information provided.  Will have our RUSH team nurse call you to schedule appointment to start and review pre-medication regimen you will take day before and day of RUSH day.   - Depomedrol injection given today to help with acute symptoms  Mild intermittent asthma, allergy driven - Lung function testing is normal - Daily controller medication(s): Singulair 10mg  daily - Prior to physical activity if needed: albuterol 2 puffs 10-15  minutes before physical activity. - Rescue medications: albuterol 2 puffs every 4-6 hours as needed  - Breathing control goals:  * Full participation in all desired activities (may need albuterol before activity) * Albuterol use two time or less a week on average (not counting use with activity) * Cough interfering with sleep two time or less a month * Oral steroids no more than once a year * No hospitalizations  Allergic urticaria - For allergic hives (itchy rash) the antihistamine and Singulair are good medications to help with this control  Follow-up in 3-4 months or sooner if needed  I appreciate the opportunity to take part in Kass's care. Please do not hesitate to contact me with questions.  Sincerely,   Margo Aye, MD Allergy/Immunology [facility]

## 2022-10-30 NOTE — Patient Instructions (Signed)
-   Will obtain serum IgE levels for environmental panel (antihistamines do not interfere with this testing) - Previous skin testing from 2018 positive to tree pollen, weed pollen, grass pollen, dust mites, mold.    Continue avoidance measures for these allergens. - Stop taking: Zyrtec for now while we try a different antihistamine.   - Continue with: Singulair (montelukast) 10mg  daily - Start taking: Ryvent (carbinoxamine) 6mg  tablet 2 times daily.  This is an antihistamine.  Ryaltris (olopatadine/mometasone) two sprays per nostril 2 times daily for congestion and/or drainage.  This is a combination nasal spray.   Lastacaft 1 drop each eye up to twice a day as needed for itchy/watery eyes.   - Perform nasal saline rinses daily to remove allergens from the nasal cavities as well as help with mucous clearance (this is especially helpful to do before the nasal sprays are given) - Recommend starting allergy shots as a means of long-term control.   Allergy shots "re-train" and "reset" the immune system to ignore environmental allergens and decrease the resulting immune response to those allergens (sneezing, itchy watery eyes, runny nose, nasal congestion, etc).   Allergy shots improve symptoms in ~85% of patients.    We have discussed both traditional build-up option and RUSH build-up option and information provided.  Will have our RUSH team nurse call you to schedule appointment to start and review pre-medication regimen you will take day before and day of RUSH day.   - Depomedrol injection given today to help with acute symptoms  - Lung function testing is normal - Daily controller medication(s): Singulair 10mg  daily - Prior to physical activity if needed: albuterol 2 puffs 10-15 minutes before physical activity. - Rescue medications: albuterol 2 puffs every 4-6 hours as needed  - Breathing control goals:  * Full participation in all desired activities (may need albuterol before activity) * Albuterol  use two time or less a week on average (not counting use with activity) * Cough interfering with sleep two time or less a month * Oral steroids no more than once a year * No hospitalizations  - For allergic hives (itchy rash) the antihistamine and Singulair are good medications to help with this control  Follow-up in 3-4 months or sooner if needed

## 2022-10-30 NOTE — Telephone Encounter (Signed)
Patient called wanting to start allergy shots patient plans to bring in signed forms and schedule an appointment for rush.  Later on today 10/30/22 as the [place] office.

## 2022-10-30 NOTE — Telephone Encounter (Signed)
Patient interested in starting rush. Patient signed consents and they are in [place] office. Patient is waiting for a call to schedule appointment. Patients call back number is [phone]

## 2022-11-03 LAB — ALLERGENS W/TOTAL IGE AREA 2
Alternaria Alternata IgE: <0.1 kU/L
Aspergillus Fumigatus IgE: <0.1 kU/L
Bermuda Grass IgE: <0.1 kU/L
Cat Dander IgE: <0.1 kU/L
Cedar, Mountain IgE: <0.1 kU/L
Cladosporium Herbarum IgE: <0.1 kU/L
Cockroach, German IgE: <0.1 kU/L
Common Silver Birch IgE: 0.24 kU/L — AB
Cottonwood IgE: 0.11 kU/L — AB
D Farinae IgE: <0.1 kU/L
D Pteronyssinus IgE: <0.1 kU/L
Dog Dander IgE: <0.1 kU/L
Elm, American IgE: 0.17 kU/L — AB
IgE (Immunoglobulin E), Serum: 65 IU/mL (ref 6–495)
Johnson Grass IgE: <0.1 kU/L
Maple/Box Elder IgE: <0.1 kU/L
Mouse Urine IgE: <0.1 kU/L
Oak, White IgE: <0.1 kU/L
Pecan, Hickory IgE: 2.35 kU/L — AB
Penicillium Chrysogen IgE: <0.1 kU/L
Pigweed, Rough IgE: <0.1 kU/L
Ragweed, Short IgE: 0.12 kU/L — AB
Sheep Sorrel IgE Qn: <0.1 kU/L
Timothy Grass IgE: <0.1 kU/L
White Mulberry IgE: 0.52 kU/L — AB

## 2022-11-05 NOTE — Addendum Note (Signed)
Addended by: Lorrin Mais on: 11/05/2022 05:26 PM   Modules accepted: Orders

## 2022-11-06 NOTE — Progress Notes (Signed)
Aeroallergen Immunotherapy  Ordering Provider: Dr. Margo Aye  Patient Details Name: Derek Love MRN: [id number] Date of Birth: [birth year]  Order 1 of 1  Vial Label: pollen  0.3 ml (Volume)  1:20 Concentration -- Ragweed Mix 0.5 ml (Volume)  1:20 Concentration -- Eastern 10 Tree Mix (also Sweet Gum) 0.2 ml (Volume)  1:10 Concentration -- Pecan Pollen   1.0  ml Extract Subtotal 4.0  ml Diluent 5.0  ml Maintenance Total  Schedule:  [facility] then B Silver Vial (1:1,000,000): Schedule B (6 doses) Blue Vial (1:100,000): Schedule B (6 doses) Yellow Vial (1:10,000): Schedule B (6 doses) Green Vial (1:1,000): Schedule B (6 doses) Red Vial (1:100): Schedule A (14 doses)  Special Instructions: [facility] in [place] then 1-2 inj/[place] in [place]

## 2022-11-06 NOTE — Progress Notes (Signed)
MAKE CLOSER TO APPT. 

## 2022-11-12 DIAGNOSIS — J301 Allergic rhinitis due to pollen: Secondary | ICD-10-CM | POA: Diagnosis not present

## 2022-11-12 NOTE — Progress Notes (Signed)
VIALS EXP 11-12-23 

## 2022-12-06 ENCOUNTER — Encounter: Payer: Managed Care, Other (non HMO) | Admitting: Allergy

## 2022-12-06 NOTE — Progress Notes (Signed)
[facility] rescheduled for July 12.   Office did not provide the premedication regimen to pt for scheduled R[facility] today.   This visit was canceled.   This encounter was created in error - please disregard.

## 2022-12-11 MED ORDER — FAMOTIDINE 20 MG PO TABS: ORAL_TABLET | ORAL | 0 refills | Status: DC

## 2022-12-11 MED ORDER — PREDNISONE 20 MG PO TABS: ORAL_TABLET | ORAL | 0 refills | Status: DC

## 2022-12-11 MED ORDER — EPINEPHRINE 0.3 MG/0.3ML IJ SOAJ: 0.3000 mg | INTRAMUSCULAR | 1 refills | Status: DC | PRN

## 2022-12-16 ENCOUNTER — Encounter: Payer: Self-pay | Admitting: Family Medicine

## 2022-12-16 ENCOUNTER — Ambulatory Visit (INDEPENDENT_AMBULATORY_CARE_PROVIDER_SITE_OTHER): Payer: 59 | Admitting: Family Medicine

## 2022-12-16 VITALS — BP 120/80 | HR 61 | Temp 98.7°F | Wt 224.0 lb

## 2022-12-16 DIAGNOSIS — R0989 Other specified symptoms and signs involving the circulatory and respiratory systems: Secondary | ICD-10-CM

## 2022-12-16 DIAGNOSIS — J069 Acute upper respiratory infection, unspecified: Secondary | ICD-10-CM

## 2022-12-16 LAB — POC COVID19 BINAXNOW: SARS Coronavirus 2 Ag: NEGATIVE

## 2022-12-16 MED ORDER — TOBRAMYCIN-DEXAMETHASONE 0.3-0.1 % OP SUSP: 2.0000 [drp] | OPHTHALMIC | 0 refills | Status: DC

## 2022-12-16 NOTE — Progress Notes (Signed)
   Subjective:    Patient ID: Derek Love, male    DOB: 04-30-1971, 52 y.o.   MRN: [id number]  HPI Here for 5 days of eye irritation with sticky mucus in both eyes, a ST, and a dry cough. No fever or SOB. No NVD. Taking Delsym.    Review of Systems  Constitutional: Negative.   HENT:  Positive for sore throat. Negative for congestion, ear pain, postnasal drip and sinus pain.   Eyes:  Positive for discharge and redness.  Respiratory:  Positive for cough. Negative for shortness of breath and wheezing.        Objective:   Physical Exam Constitutional:      Appearance: Normal appearance.  HENT:     Right Ear: Tympanic membrane, ear canal and external ear normal.     Left Ear: Tympanic membrane, ear canal and external ear normal.     Nose: Nose normal.     Mouth/Throat:     Pharynx: Oropharynx is clear.  Eyes:     Pupils: Pupils are equal, round, and reactive to light.     Comments: Both eyes are pink with yellow DC along the lid margins   Pulmonary:     Effort: Pulmonary effort is normal.     Breath sounds: Normal breath sounds.  Lymphadenopathy:     Cervical: No cervical adenopathy.  Neurological:     Mental Status: He is alert.           Assessment & Plan:  Viral URI. Use Tobradex drops for the eyes. Drink fluids. Recheck as needed.  Gershon Crane, MD

## 2022-12-17 ENCOUNTER — Ambulatory Visit: Payer: 59 | Admitting: Adult Health

## 2022-12-19 ENCOUNTER — Encounter: Payer: 59 | Admitting: Adult Health

## 2022-12-19 ENCOUNTER — Telehealth: Payer: Managed Care, Other (non HMO) | Admitting: Physician Assistant

## 2022-12-19 ENCOUNTER — Encounter: Payer: Self-pay | Admitting: Family Medicine

## 2022-12-19 DIAGNOSIS — J028 Acute pharyngitis due to other specified organisms: Secondary | ICD-10-CM

## 2022-12-19 DIAGNOSIS — B9689 Other specified bacterial agents as the cause of diseases classified elsewhere: Secondary | ICD-10-CM

## 2022-12-19 DIAGNOSIS — J019 Acute sinusitis, unspecified: Secondary | ICD-10-CM | POA: Diagnosis not present

## 2022-12-19 DIAGNOSIS — R051 Acute cough: Secondary | ICD-10-CM | POA: Diagnosis not present

## 2022-12-19 MED ORDER — AMOXICILLIN-POT CLAVULANATE 875-125 MG PO TABS
1.0000 | ORAL_TABLET | Freq: Two times a day (BID) | ORAL | 0 refills | Status: AC
Start: 2022-12-19 — End: ?

## 2022-12-19 MED ORDER — PROMETHAZINE-DM 6.25-15 MG/5ML PO SYRP
5.0000 mL | ORAL_SOLUTION | Freq: Four times a day (QID) | ORAL | 0 refills | Status: AC | PRN
Start: 2022-12-19 — End: ?

## 2022-12-19 NOTE — Progress Notes (Signed)
Virtual Visit Consent   Derek Love, you are scheduled for a virtual visit with a [facility] provider today. Just as with appointments in the office, your consent must be obtained to participate. Your consent will be active for this visit and any virtual visit you may have with one of our providers in the next 365 days. If you have a MyChart account, a copy of this consent can be sent to you electronically.  As this is a virtual visit, video technology does not allow for your provider to perform a traditional examination. This may limit your provider's ability to fully assess your condition. If your provider identifies any concerns that need to be evaluated in person or the need to arrange testing (such as labs, EKG, etc.), we will make arrangements to do so. Although advances in technology are sophisticated, we cannot ensure that it will always work on either your end or our end. If the connection with a video visit is poor, the visit may have to be switched to a telephone visit. With either a video or telephone visit, we are not always able to ensure that we have a secure connection.  By engaging in this virtual visit, you consent to the provision of healthcare and authorize for your insurance to be billed (if applicable) for the services provided during this visit. Depending on your insurance coverage, you may receive a charge related to this service.  I need to obtain your verbal consent now. Are you willing to proceed with your visit today? Derek Love has provided verbal consent on 12/19/2022 for a virtual visit (video or telephone). [name], PA-C  Date: 12/19/2022 11:34 AM  Virtual Visit via Video Note   I, [name], connected with  Derek Love  ([id number], 02-12-1971) on 12/19/22 at 11:30 AM EDT by a video-enabled telemedicine application and verified that I am speaking with the correct person using two identifiers.  Location: Patient: Virtual Visit  Location Patient: Home Provider: Virtual Visit Location Provider: Home Office   I discussed the limitations of evaluation and management by telemedicine and the availability of in person appointments. The patient expressed understanding and agreed to proceed.    History of Present Illness: Derek Love is a 52 y.o. who identifies as a male who was assigned male at birth, and is being seen today for URI/Sinus symptoms.  HPI: URI  This is a new problem. The current episode started 1 to 4 weeks ago (10 days; Saw PCP office on 12/16/22 and diagnosed with viral URI. Giving Tobradex drops for possible conjunctivitis). The problem has been gradually worsening. Maximum temperature: subjective fever. Associated symptoms include congestion, coughing, headaches, rhinorrhea, sinus pain (pressure), a sore throat and swollen glands. Pertinent negatives include no diarrhea, ear pain, nausea, neck pain, plugged ear sensation or vomiting. Associated symptoms comments: Hoarse voice, conjunctivitis improved, chills, decreased appetite. Treatments tried: mucinex. The treatment provided no relief.     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    Allergies: No Known  Allergies Medications:  Current Outpatient Medications:    amoxicillin-clavulanate (AUGMENTIN) 875-125 MG tablet, Take 1 tablet by mouth 2 (two) times daily., Disp: 20 tablet, Rfl: 0   promethazine-dextromethorphan (PROMETHAZINE-DM) 6.25-15 MG/5ML syrup, Take 5 mLs by  mouth 4 (four) times daily as needed., Disp: 118 mL, Rfl: 0   Carbinoxamine Maleate (RYVENT) 6 MG TABS, Take 1 tablet (6 mg total) by mouth in the morning and at bedtime., Disp: 120 tablet, Rfl: 5   [employer] SUNSCREEN SPF 30 EX, , Disp: , Rfl:    EPINEPHrine 0.3 mg/0.3 mL IJ SOAJ injection, Inject 0.3 mg into the muscle as needed for anaphylaxis. Bring EpiPen with you to your [facility] appointment., Disp: 0.3 mL, Rfl: 1   Lansoprazole (PREVACID PO), Take 10 mg by mouth., Disp: , Rfl:    montelukast (SINGULAIR) 10 MG tablet, Take 1 tablet (10 mg total) by mouth at bedtime., Disp: 90 tablet, Rfl: 1   olmesartan (BENICAR) 40 MG tablet, TAKE ONE TABLET BY MOUTH ONE TIME DAILY, Disp: 90 tablet, Rfl: 3   tobramycin-dexamethasone (TOBRADEX) ophthalmic solution, Place 2 drops into both eyes every 4 (four) hours while awake., Disp: 5 mL, Rfl: 0  Observations/Objective: Patient is well-developed, well-nourished in no acute distress.  Resting comfortably at home.  Head is normocephalic, atraumatic.  No labored breathing.  Speech is clear and coherent with logical content.  Patient is alert and oriented at baseline.    Assessment and Plan: 1. Acute bacterial sinusitis - amoxicillin-clavulanate (AUGMENTIN) 875-125 MG tablet; Take 1 tablet by mouth 2 (two) times daily.  Dispense: 20 tablet; Refill: 0  2. Acute bacterial pharyngitis - amoxicillin-clavulanate (AUGMENTIN) 875-125 MG tablet; Take 1 tablet by mouth 2 (two) times daily.  Dispense: 20 tablet; Refill: 0  3. Acute cough - promethazine-dextromethorphan (PROMETHAZINE-DM) 6.25-15 MG/5ML syrup; Take 5 mLs by mouth 4 (four) times daily as needed.  Dispense: 118 mL; Refill: 0  - Worsening symptoms that have not responded to OTC medications.  - Will give Augmentin and Promethazine DM - Continue allergy medications.  - Steam and humidifier can help - Stay well hydrated and get plenty of rest.  - Seek in person evaluation if no symptom  improvement or if symptoms worsen   Follow Up Instructions: I discussed the assessment and treatment plan with the patient. The patient was provided an opportunity to ask questions and all were answered. The patient agreed with the plan and demonstrated an understanding of the instructions.  A copy of instructions were sent to the patient via MyChart unless otherwise noted below.    The patient was advised to call back or seek an in-person evaluation if the symptoms worsen or if the condition fails to improve as anticipated.  Time:  I spent 8 minutes with the patient via telehealth technology discussing the above problems/concerns.    [name], PA-C

## 2022-12-19 NOTE — Patient Instructions (Signed)
Derek Love, thank you for joining Derek Loveless, PA-C for today's virtual visit.  While this provider is not your primary care provider (PCP), if your PCP is located in our provider database this encounter information will be shared with them immediately following your visit.   A [facility] account gives you access to today's visit and all your visits, tests, and labs performed at [facility] " click here if you don't have a Derek Love account or go to [email or web address]  Consent: (Patient) Derek Love provided verbal consent for this virtual visit at the beginning of the encounter.  Current Medications:  Current Outpatient Medications:    amoxicillin-clavulanate (AUGMENTIN) 875-125 MG tablet, Take 1 tablet by mouth 2 (two) times daily., Disp: 20 tablet, Rfl: 0   promethazine-dextromethorphan (PROMETHAZINE-DM) 6.25-15 MG/5ML syrup, Take 5 mLs by mouth 4 (four) times daily as needed., Disp: 118 mL, Rfl: 0   Carbinoxamine Maleate (RYVENT) 6 MG TABS, Take 1 tablet (6 mg total) by mouth in the morning and at bedtime., Disp: 120 tablet, Rfl: 5   [employer] SUNSCREEN SPF 30 EX, , Disp: , Rfl:    EPINEPHrine 0.3 mg/0.3 mL IJ SOAJ injection, Inject 0.3 mg into the muscle as needed for anaphylaxis. Bring EpiPen with you to your [facility] appointment., Disp: 0.3 mL, Rfl: 1   Lansoprazole (PREVACID PO), Take 10 mg by mouth., Disp: , Rfl:    montelukast (SINGULAIR) 10 MG tablet, Take 1 tablet (10 mg total) by mouth at bedtime., Disp: 90 tablet, Rfl: 1   olmesartan (BENICAR) 40 MG tablet, TAKE ONE TABLET BY MOUTH ONE TIME DAILY, Disp: 90 tablet, Rfl: 3   tobramycin-dexamethasone (TOBRADEX) ophthalmic solution, Place 2 drops into both eyes every 4 (four) hours while awake., Disp: 5 mL, Rfl: 0   Medications ordered in this encounter:  Meds ordered this encounter  Medications   amoxicillin-clavulanate (AUGMENTIN) 875-125 MG tablet    Sig: Take 1  tablet by mouth 2 (two) times daily.    Dispense:  20 tablet    Refill:  0    Order Specific Question:   Supervising Provider    Answer:   Merrilee Jansky [id number]   promethazine-dextromethorphan (PROMETHAZINE-DM) 6.25-15 MG/5ML syrup    Sig: Take 5 mLs by mouth 4 (four) times daily as needed.    Dispense:  118 mL    Refill:  0    Order Specific Question:   Supervising Provider    Answer:   Merrilee Jansky [[id number]]     *If you need refills on other medications prior to your next appointment, please contact your pharmacy*  Follow-Up: Call back or seek an in-person evaluation if the symptoms worsen or if the condition fails to improve as anticipated.  [facility] [phone]  Other Instructions  Upper Respiratory Infection, Adult An upper respiratory infection (URI) is a common viral infection of the nose, throat, and upper air passages that lead to the lungs. The most common type of URI is the common cold. URIs usually get better on their own, without medical treatment. What are the causes? A URI is caused by a virus. You may catch a virus by: Breathing in droplets from an infected person's cough or sneeze. Touching something that has been exposed to the virus (is contaminated) and then touching your mouth, nose, or eyes. What increases the risk? You are more likely to get a URI if: You are very young or very old. You have close contact  with others, such as at work, school, or a health care facility. You smoke. You have long-term (chronic) heart or lung disease. You have a weakened disease-fighting system (immune system). You have nasal allergies or asthma. You are experiencing a lot of stress. You have poor nutrition. What are the signs or symptoms? A URI usually involves some of the following symptoms: Runny or stuffy (congested) nose. Cough. Sneezing. Sore throat. Headache. Fatigue. Fever. Loss of appetite. Pain in your forehead, behind your  eyes, and over your cheekbones (sinus pain). Muscle aches. Redness or irritation of the eyes. Pressure in the ears or face. How is this diagnosed? This condition may be diagnosed based on your medical history and symptoms, and a physical exam. Your health care provider may use a swab to take a mucus sample from your nose (nasal swab). This sample can be tested to determine what virus is causing the illness. How is this treated? URIs usually get better on their own within 7-10 days. Medicines cannot cure URIs, but your health care provider may recommend certain medicines to help relieve symptoms, such as: Over-the-counter cold medicines. Cough suppressants. Coughing is a type of defense against infection that helps to clear the respiratory system, so take these medicines only as recommended by your health care provider. Fever-reducing medicines. Follow these instructions at home: Activity Rest as needed. If you have a fever, stay home from work or school until your fever is gone or until your health care provider says your URI cannot spread to other people (is no longer contagious). Your health care provider may have you wear a face mask to prevent your infection from spreading. Relieving symptoms Gargle with a mixture of salt and water 3-4 times a day or as needed. To make salt water, completely dissolve -1 tsp (3-6 g) of salt in 1 cup (237 mL) of warm water. Use a cool-mist humidifier to add moisture to the air. This can help you breathe more easily. Eating and drinking  Drink enough fluid to keep your urine pale yellow. Eat soups and other clear broths. General instructions  Take over-the-counter and prescription medicines only as told by your health care provider. These include cold medicines, fever reducers, and cough suppressants. Do not use any products that contain nicotine or tobacco. These products include cigarettes, chewing tobacco, and vaping devices, such as e-cigarettes. If  you need help quitting, ask your health care provider. Stay away from secondhand smoke. Stay up to date on all immunizations, including the yearly (annual) flu vaccine. Keep all follow-up visits. This is important. How to prevent the spread of infection to others URIs can be contagious. To prevent the infection from spreading: Wash your hands with soap and water for at least 20 seconds. If soap and water are not available, use hand sanitizer. Avoid touching your mouth, face, eyes, or nose. Cough or sneeze into a tissue or your sleeve or elbow instead of into your hand or into the air.  Contact a health care provider if: You are getting worse instead of better. You have a fever or chills. Your mucus is brown or red. You have yellow or brown discharge coming from your nose. You have pain in your face, especially when you bend forward. You have swollen neck glands. You have pain while swallowing. You have white areas in the back of your throat. Get help right away if: You have shortness of breath that gets worse. You have severe or persistent: Headache. Ear pain. Sinus pain. Chest pain.  You have chronic lung disease along with any of the following: Making high-pitched whistling sounds when you breathe, most often when you breathe out (wheezing). Prolonged cough (more than 14 days). Coughing up blood. A change in your usual mucus. You have a stiff neck. You have changes in your: Vision. Hearing. Thinking. Mood. These symptoms may be an emergency. Get help right away. Call 911. Do not wait to see if the symptoms will go away. Do not drive yourself to the hospital. Summary An upper respiratory infection (URI) is a common infection of the nose, throat, and upper air passages that lead to the lungs. A URI is caused by a virus. URIs usually get better on their own within 7-10 days. Medicines cannot cure URIs, but your health care provider may recommend certain medicines to help  relieve symptoms. This information is not intended to replace advice given to you by your health care provider. Make sure you discuss any questions you have with your health care provider. Document Revised: 01/17/2021 Document Reviewed: 01/17/2021 Elsevier Patient Education  2024 Elsevier Inc.    If you have been instructed to have an in-person evaluation today at a local Urgent Care facility, please use the link below. It will take you to a list of all of our available [facility], including address, phone number and hours of operation. Please do not delay care.  [facility]  If you or a family member do not have a primary care provider, use the link below to schedule a visit and establish care. When you choose a [facility] primary care physician or advanced practice provider, you gain a long-term partner in health. Find a Primary Care Provider  Learn more about [facility]'s in-office and virtual care options: [employer] - Get Care Now

## 2022-12-20 NOTE — Telephone Encounter (Signed)
Rx not sent, spoke with pt spouse stated that pt is already taking the prescribed Augmentin on 12/19/22

## 2022-12-20 NOTE — Telephone Encounter (Signed)
It's time to treat this. Call in Augmentin 875 BID for 10 days

## 2023-01-02 ENCOUNTER — Encounter: Payer: Self-pay | Admitting: Family Medicine

## 2023-01-03 ENCOUNTER — Telehealth: Payer: Managed Care, Other (non HMO) | Admitting: Nurse Practitioner

## 2023-01-03 DIAGNOSIS — J40 Bronchitis, not specified as acute or chronic: Secondary | ICD-10-CM | POA: Diagnosis not present

## 2023-01-03 MED ORDER — BENZONATATE 100 MG PO CAPS
100.0000 mg | ORAL_CAPSULE | Freq: Three times a day (TID) | ORAL | 0 refills | Status: AC | PRN
Start: 2023-01-03 — End: ?

## 2023-01-03 MED ORDER — AZITHROMYCIN 250 MG PO TABS
ORAL_TABLET | ORAL | 0 refills | Status: AC
Start: 2023-01-03 — End: 2023-01-08

## 2023-01-03 NOTE — Progress Notes (Signed)
Virtual Visit Consent   Derek Love, you are scheduled for a virtual visit with a Derek Love provider today. Just as with appointments in the office, your consent must be obtained to participate. Your consent will be active for this visit and any virtual visit you may have with one of our providers in the next 365 days. If you have a MyChart account, a copy of this consent can be sent to you electronically.  As this is a virtual visit, video technology does not allow for your provider to perform a traditional examination. This may limit your provider's ability to fully assess your condition. If your provider identifies any concerns that need to be evaluated in person or the need to arrange testing (such as labs, EKG, etc.), we will make arrangements to do so. Although advances in technology are sophisticated, we cannot ensure that it will always work on either your end or our end. If the connection with a video visit is poor, the visit may have to be switched to a telephone visit. With either a video or telephone visit, we are not always able to ensure that we have a secure connection.  By engaging in this virtual visit, you consent to the provision of healthcare and authorize for your insurance to be billed (if applicable) for the services provided during this visit. Depending on your insurance coverage, you may receive a charge related to this service.  I need to obtain your verbal consent now. Are you willing to proceed with your visit today? Derek Love has provided verbal consent on 01/03/2023 for a virtual visit (video or telephone). Derek Simas, FNP  Date: 01/03/2023 5:14 PM  Virtual Visit via Video Note   I, Derek Love, connected with  Derek Love  ([id number], [birth year]) on 01/03/23 at  5:15 PM EDT by a video-enabled telemedicine application and verified that I am speaking with the correct person using two identifiers.  Location: Patient: Virtual Visit Location Patient:  Home Provider: Virtual Visit Location Provider: Home Office   I discussed the limitations of evaluation and management by telemedicine and the availability of in person appointments. The patient expressed understanding and agreed to proceed.    History of Present Illness: Derek Love is a 52 y.o. who identifies as a male who was assigned male at birth, and is being seen today for recurrent sinus symptoms   He was treated for sinus infection on 12/19/22 and was treated with Augmentin. Started to feel better on the antidotic but was getting diarrhea so he stopped the antibiotic early.   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 Bring EpiPen with you to  your [facility] appointment., Disp: 0.3 mL, Rfl: 1   Lansoprazole (PREVACID PO), Take 10 mg by mouth., Disp: , Rfl:    montelukast (SINGULAIR) 10 MG tablet, Take 1 tablet (10 mg total) by mouth at bedtime., Disp: 90 tablet, Rfl: 1   olmesartan (BENICAR) 40 MG tablet, TAKE ONE TABLET BY MOUTH ONE TIME DAILY, Disp: 90 tablet, Rfl: 3   promethazine-dextromethorphan (PROMETHAZINE-DM) 6.25-15 MG/5ML syrup, Take 5 mLs by mouth 4 (four) times daily as needed., Disp: 118 mL, Rfl: 0   tobramycin-dexamethasone (TOBRADEX) ophthalmic solution, Place 2 drops into both eyes every 4 (four) hours while awake., Disp: 5 mL, Rfl: 0  Observations/Objective: Patient is well-developed, well-nourished in no acute distress.  Resting comfortably  at home.  Head is normocephalic, atraumatic.  No labored breathing.  Speech is clear and coherent with logical content.  Patient is alert and oriented at baseline.    Assessment and Plan:  1. Bronchitis Continue to use Albuterol as needed  Flonase daily  Cough drops or lozenges to help with sore throat  Cool air humidifier   - benzonatate (TESSALON) 100 MG capsule; Take 1 capsule (100 mg total) by mouth 3 (three) times daily as needed.  Dispense: 30 capsule; Refill: 0 - azithromycin (ZITHROMAX) 250 MG tablet; Take 2 tablets on day 1, then 1 tablet daily on days 2 through 5  Dispense: 6 tablet; Refill: 0     Follow Up Instructions: I discussed the assessment and treatment plan with the patient. The patient was provided an opportunity to ask questions and all were answered. The patient agreed with the plan and demonstrated an understanding of the instructions.  A copy of instructions were sent to the patient via MyChart unless otherwise noted below.    The patient was advised to call back or seek an in-person evaluation if the symptoms worsen or if the condition fails to improve as anticipated.  Time:  I spent 11 minutes with the patient via telehealth  technology discussing the above problems/concerns.    Derek Simas, FNP

## 2023-01-10 ENCOUNTER — Encounter: Payer: Self-pay | Admitting: Allergy

## 2023-01-10 ENCOUNTER — Other Ambulatory Visit: Payer: Self-pay

## 2023-01-10 ENCOUNTER — Ambulatory Visit: Payer: Managed Care, Other (non HMO) | Admitting: Allergy

## 2023-01-10 VITALS — BP 128/80 | HR 66 | Temp 98.3°F | Resp 18

## 2023-01-10 DIAGNOSIS — J4520 Mild intermittent asthma, uncomplicated: Secondary | ICD-10-CM | POA: Diagnosis not present

## 2023-01-10 DIAGNOSIS — J3089 Other allergic rhinitis: Secondary | ICD-10-CM | POA: Diagnosis not present

## 2023-01-10 DIAGNOSIS — H1013 Acute atopic conjunctivitis, bilateral: Secondary | ICD-10-CM | POA: Diagnosis not present

## 2023-01-10 NOTE — Progress Notes (Signed)
RAPID DESENSITIZATION Note  RE: Derek Love MRN: [id number] DOB: 25-Nov-1970 Date of Office Visit: 01/10/2023  Subjective:  Patient presents today for rapid desensitization.  Interval History: Patient did have illness 3 weeks ago and completed antibiotic course.  He is feeling well today; he has taken all premedications as per protocol.  Recent/Current History: Pulmonary disease: no Cardiac disease: no Respiratory infection:  recovered, completed antibiotics Rash: no Itch: no Swelling: no Cough:  reports nervous cough; recovered from respiratory illness with antibiotics as above Shortness of breath: no Runny/stuffy nose: no Itchy eyes: no Beta-blocker use: no  Patient/guardian was informed of the procedure with verbalized understanding of the risk of anaphylaxis. Consent has been signed.   Medication List:  Current Outpatient Medications  Medication Sig Dispense Refill   amoxicillin-clavulanate (AUGMENTIN) 875-125 MG tablet Take 1 tablet by mouth 2 (two) times daily. 20 tablet 0   benzonatate (TESSALON) 100 MG capsule Take 1 capsule (100 mg total) by mouth 3 (three) times daily as needed. 30 capsule 0   Carbinoxamine Maleate (RYVENT) 6 MG TABS Take 1 tablet (6 mg total) by mouth in the morning and at bedtime. 120 tablet 5   [employer] SUNSCREEN SPF 30 EX      EPINEPHrine 0.3 mg/0.3 mL IJ SOAJ injection Inject 0.3 mg into the muscle as needed for anaphylaxis. Bring EpiPen with you to your [facility] appointment. 0.3 mL 1   Lansoprazole (PREVACID PO) Take 10 mg by mouth.     montelukast (SINGULAIR) 10 MG tablet Take 1 tablet (10 mg total) by mouth at bedtime. 90 tablet 1   olmesartan (BENICAR) 40 MG tablet TAKE ONE TABLET BY MOUTH ONE TIME DAILY 90 tablet 3   promethazine-dextromethorphan (PROMETHAZINE-DM) 6.25-15 MG/5ML syrup Take 5 mLs by mouth 4 (four) times daily as needed. 118 mL 0   tobramycin-dexamethasone (TOBRADEX) ophthalmic solution Place 2 drops into both eyes  every 4 (four) hours while awake. 5 mL 0   No current facility-administered medications for this visit.   Allergies: No Known Allergies I reviewed his past medical history, social history, family history, and environmental history and no significant changes have been reported from his previous visit.  ROS: Negative except as per HPI.  Objective: BP 124/70 (BP Location: Left Arm, Patient Position: Sitting, Cuff Size: Normal)   Pulse 81   Temp 98.3 F (36.8 C) (Temporal)   Resp 18   SpO2 95%  There is no height or weight on file to calculate BMI.   General Appearance:  Alert, cooperative, no distress, appears stated age  Head:  Normocephalic, without obvious abnormality, atraumatic  Eyes:  Conjunctiva clear, EOM's intact  Nose: Nares normal  Throat: Lips, tongue normal; teeth and gums normal, normal posterior oropharnyx  Neck: Supple, symmetrical  Lungs:   CTAB, Respirations unlabored, no coughing  Heart:  Appears well perfused  Extremities: No edema  Skin: Skin color, texture, turgor normal, no rashes or lesions on visualized portions of skin  Neurologic: No gross deficits     Diagnostics: Spirometry:  Tracings reviewed. His effort: Good reproducible efforts. FVC: 4.48L, 91% predicted FEV1: 3.8L, 98%% predicted Interpretation: Spirometry consistent with normal pattern.  Please see scanned spirometry results for details.  PROCEDURES:  Patient received the following doses every hour: Step 1:  0.61ml - 1:1,000,000 dilution (silver vial) Step 2:  0.83ml - 1:1,000,000 dilution (silver vial) Step 3: 0.61ml - 1:100,000 dilution (blue vial)  Step 4: 0.103ml - 1:100,000 dilution (blue vial)  Step 5: 0.51ml -  1:10,000 dilution (gold vial) Step 6: 0.11ml - 1:10,000 dilution (gold vial) Step 7: 0.32ml - 1:10,000 dilution (gold vial) Step 8: 0.47ml - 1:10,000 dilution (gold vial)  Patient was observed for 1 hour after the last dose.   Procedure started at 8:55am Procedure ended at  3:41pm   ASSESSMENT/PLAN:   Patient has tolerated the rapid desensitization protocol.  Next appointment: Start at 0.44ml of 1:1000 dilution (green vial) and build up per protocol.  Margo Aye, MD [facility]

## 2023-01-10 NOTE — Patient Instructions (Addendum)
Return for next allergy shot 01/17/23.  After your next allergy shot the following week you can come whenever works best for your schedule and can come 1-2 times a week (with 48 hours in between) if you would like.  Bring epipen on your allergy shot days. Take antihistamine on your allergy shot days.  Tonight take last dose of Pepcid.

## 2023-01-17 ENCOUNTER — Ambulatory Visit (INDEPENDENT_AMBULATORY_CARE_PROVIDER_SITE_OTHER): Payer: Managed Care, Other (non HMO) | Admitting: *Deleted

## 2023-01-17 DIAGNOSIS — J309 Allergic rhinitis, unspecified: Secondary | ICD-10-CM | POA: Diagnosis not present

## 2023-01-21 ENCOUNTER — Encounter: Payer: Self-pay | Admitting: Allergy

## 2023-01-24 ENCOUNTER — Ambulatory Visit (INDEPENDENT_AMBULATORY_CARE_PROVIDER_SITE_OTHER): Payer: Managed Care, Other (non HMO) | Admitting: *Deleted

## 2023-01-24 DIAGNOSIS — J309 Allergic rhinitis, unspecified: Secondary | ICD-10-CM | POA: Diagnosis not present

## 2023-01-31 ENCOUNTER — Encounter: Payer: Self-pay | Admitting: Allergy

## 2023-02-04 ENCOUNTER — Ambulatory Visit: Payer: 59 | Admitting: Adult Health

## 2023-02-04 ENCOUNTER — Ambulatory Visit (INDEPENDENT_AMBULATORY_CARE_PROVIDER_SITE_OTHER): Payer: Managed Care, Other (non HMO)

## 2023-02-04 DIAGNOSIS — J309 Allergic rhinitis, unspecified: Secondary | ICD-10-CM

## 2023-02-06 ENCOUNTER — Ambulatory Visit (INDEPENDENT_AMBULATORY_CARE_PROVIDER_SITE_OTHER): Payer: Managed Care, Other (non HMO)

## 2023-02-06 DIAGNOSIS — J309 Allergic rhinitis, unspecified: Secondary | ICD-10-CM

## 2023-02-11 ENCOUNTER — Ambulatory Visit (INDEPENDENT_AMBULATORY_CARE_PROVIDER_SITE_OTHER): Payer: Managed Care, Other (non HMO) | Admitting: *Deleted

## 2023-02-11 DIAGNOSIS — J309 Allergic rhinitis, unspecified: Secondary | ICD-10-CM

## 2023-02-13 ENCOUNTER — Ambulatory Visit (INDEPENDENT_AMBULATORY_CARE_PROVIDER_SITE_OTHER): Payer: Managed Care, Other (non HMO) | Admitting: *Deleted

## 2023-02-13 DIAGNOSIS — J309 Allergic rhinitis, unspecified: Secondary | ICD-10-CM

## 2023-02-18 ENCOUNTER — Ambulatory Visit (INDEPENDENT_AMBULATORY_CARE_PROVIDER_SITE_OTHER): Payer: Managed Care, Other (non HMO) | Admitting: *Deleted

## 2023-02-18 DIAGNOSIS — J309 Allergic rhinitis, unspecified: Secondary | ICD-10-CM | POA: Diagnosis not present

## 2023-02-21 ENCOUNTER — Ambulatory Visit (INDEPENDENT_AMBULATORY_CARE_PROVIDER_SITE_OTHER): Payer: Managed Care, Other (non HMO) | Admitting: *Deleted

## 2023-02-21 DIAGNOSIS — J309 Allergic rhinitis, unspecified: Secondary | ICD-10-CM | POA: Diagnosis not present

## 2023-03-04 ENCOUNTER — Ambulatory Visit (INDEPENDENT_AMBULATORY_CARE_PROVIDER_SITE_OTHER): Payer: Managed Care, Other (non HMO) | Admitting: *Deleted

## 2023-03-04 DIAGNOSIS — J309 Allergic rhinitis, unspecified: Secondary | ICD-10-CM

## 2023-03-07 ENCOUNTER — Ambulatory Visit (INDEPENDENT_AMBULATORY_CARE_PROVIDER_SITE_OTHER): Payer: Managed Care, Other (non HMO) | Admitting: *Deleted

## 2023-03-07 DIAGNOSIS — J309 Allergic rhinitis, unspecified: Secondary | ICD-10-CM | POA: Diagnosis not present

## 2023-03-10 ENCOUNTER — Ambulatory Visit (INDEPENDENT_AMBULATORY_CARE_PROVIDER_SITE_OTHER): Payer: Self-pay

## 2023-03-10 DIAGNOSIS — J309 Allergic rhinitis, unspecified: Secondary | ICD-10-CM | POA: Diagnosis not present

## 2023-03-13 ENCOUNTER — Ambulatory Visit (INDEPENDENT_AMBULATORY_CARE_PROVIDER_SITE_OTHER): Payer: Managed Care, Other (non HMO)

## 2023-03-13 DIAGNOSIS — J309 Allergic rhinitis, unspecified: Secondary | ICD-10-CM | POA: Diagnosis not present

## 2023-03-18 ENCOUNTER — Ambulatory Visit (INDEPENDENT_AMBULATORY_CARE_PROVIDER_SITE_OTHER): Payer: Self-pay | Admitting: *Deleted

## 2023-03-18 DIAGNOSIS — J309 Allergic rhinitis, unspecified: Secondary | ICD-10-CM | POA: Diagnosis not present

## 2023-03-19 ENCOUNTER — Encounter: Payer: Self-pay | Admitting: Adult Health

## 2023-03-20 ENCOUNTER — Ambulatory Visit (INDEPENDENT_AMBULATORY_CARE_PROVIDER_SITE_OTHER): Payer: Managed Care, Other (non HMO)

## 2023-03-20 DIAGNOSIS — J309 Allergic rhinitis, unspecified: Secondary | ICD-10-CM | POA: Diagnosis not present

## 2023-03-20 NOTE — Telephone Encounter (Signed)
Pt has cpe sch for 04-02-2023

## 2023-03-21 MED ORDER — OLMESARTAN MEDOXOMIL 40 MG PO TABS: 40.0000 mg | ORAL_TABLET | Freq: Every day | ORAL | 3 refills | Status: DC

## 2023-03-21 NOTE — Telephone Encounter (Signed)
Rx has been filled

## 2023-03-21 NOTE — Telephone Encounter (Signed)
Okay to take over this medication for pt? Please advise

## 2023-03-25 ENCOUNTER — Ambulatory Visit (INDEPENDENT_AMBULATORY_CARE_PROVIDER_SITE_OTHER): Payer: Managed Care, Other (non HMO)

## 2023-03-25 DIAGNOSIS — J309 Allergic rhinitis, unspecified: Secondary | ICD-10-CM | POA: Diagnosis not present

## 2023-03-28 ENCOUNTER — Ambulatory Visit (INDEPENDENT_AMBULATORY_CARE_PROVIDER_SITE_OTHER): Payer: Managed Care, Other (non HMO) | Admitting: Adult Health

## 2023-03-28 ENCOUNTER — Encounter: Payer: Self-pay | Admitting: Adult Health

## 2023-03-28 VITALS — BP 110/70 | HR 64 | Temp 98.3°F | Ht 71.0 in | Wt 221.0 lb

## 2023-03-28 DIAGNOSIS — R1084 Generalized abdominal pain: Secondary | ICD-10-CM

## 2023-03-28 LAB — CBC WITH DIFFERENTIAL/PLATELET
Basophils Absolute: 0 10*3/uL (ref 0.0–0.1)
Basophils Relative: 0.5 % (ref 0.0–3.0)
Eosinophils Absolute: 0.3 10*3/uL (ref 0.0–0.7)
Eosinophils Relative: 3.5 % (ref 0.0–5.0)
HCT: 46.4 % (ref 39.0–52.0)
Hemoglobin: 15.5 g/dL (ref 13.0–17.0)
Lymphocytes Relative: 29.2 % (ref 12.0–46.0)
Lymphs Abs: 2.1 10*3/uL (ref 0.7–4.0)
MCHC: 33.3 g/dL (ref 30.0–36.0)
MCV: 90.6 fL (ref 78.0–100.0)
Monocytes Absolute: 0.6 10*3/uL (ref 0.1–1.0)
Monocytes Relative: 8.1 % (ref 3.0–12.0)
Neutro Abs: 4.2 10*3/uL (ref 1.4–7.7)
Neutrophils Relative %: 58.7 % (ref 43.0–77.0)
Platelets: 203 10*3/uL (ref 150.0–400.0)
RBC: 5.13 Mil/uL (ref 4.22–5.81)
RDW: 13.6 % (ref 11.5–15.5)
WBC: 7.2 10*3/uL (ref 4.0–10.5)

## 2023-03-28 LAB — URINALYSIS
Bilirubin Urine: NEGATIVE
Hgb urine dipstick: NEGATIVE
Ketones, ur: NEGATIVE
Leukocytes,Ua: NEGATIVE
Nitrite: NEGATIVE
Specific Gravity, Urine: 1.01 (ref 1.000–1.030)
Total Protein, Urine: NEGATIVE
Urine Glucose: NEGATIVE
Urobilinogen, UA: 0.2 (ref 0.0–1.0)
pH: 5.5 (ref 5.0–8.0)

## 2023-03-28 LAB — COMPREHENSIVE METABOLIC PANEL
ALT: 26 U/L (ref 0–53)
AST: 18 U/L (ref 0–37)
Albumin: 4.6 g/dL (ref 3.5–5.2)
Alkaline Phosphatase: 79 U/L (ref 39–117)
BUN: 21 mg/dL (ref 6–23)
CO2: 31 meq/L (ref 19–32)
Calcium: 9.5 mg/dL (ref 8.4–10.5)
Chloride: 99 meq/L (ref 96–112)
Creatinine, Ser: 1.15 mg/dL (ref 0.40–1.50)
GFR: 74.16 mL/min (ref >60.00)
Glucose, Bld: 99 mg/dL (ref 70–99)
Potassium: 4.4 meq/L (ref 3.5–5.1)
Sodium: 139 meq/L (ref 135–145)
Total Bilirubin: 0.5 mg/dL (ref 0.2–1.2)
Total Protein: 6.9 g/dL (ref 6.0–8.3)

## 2023-03-28 LAB — AMYLASE: Amylase: 29 U/L (ref 27–131)

## 2023-03-28 MED ORDER — PANTOPRAZOLE SODIUM 40 MG PO TBEC: 40.0000 mg | DELAYED_RELEASE_TABLET | Freq: Every day | ORAL | 3 refills | Status: DC

## 2023-03-28 MED ORDER — SUCRALFATE 1 G PO TABS: 1.0000 g | ORAL_TABLET | Freq: Three times a day (TID) | ORAL | 0 refills | Status: DC

## 2023-03-28 NOTE — Progress Notes (Signed)
Subjective:    Patient ID: Derek Love, male    DOB: 03-07-1971, 52 y.o.   MRN: [id number]  HPI  52 year old male who  has a past medical history of ADHD (attention deficit hyperactivity disorder), Allergic rhinitis, Asthma, Eczema, GERD (gastroesophageal reflux disease), Headache(784.0), Hypertension, and Positive PPD, treated.  He presents to the office today for abdominal pain.  He does report having long history of of right sided abdominal pain and acid reflux.  Had an ultrasound of the right upper quadrant in 2020 which showed no evidence of cholecystitis but did show fatty liver disease and at this time also had endoscopy which showed diffuse inflammation in the gastric fundus and gastric body with cobblestoning appearance and mottled with erythema.  Over the last 4 months abdominal pain has been getting worse is now having abdominal pain on the left upper quadrant.  He does continue to have pain in the right upper quadrant and epigastric area as well.  He has been using Prevacid which helps with his symptoms but does not resolve them.  Does feel as though his acid reflux is worse as well and is experiencing nausea.  Couple weeks ago he experienced about 4 days of fever and chills and then this spontaneously resolved.  He is not currently having any pain in the office but did have it on the way to to the office  Has not had any vomiting, diarrhea, dysuria, hematuria, urinary frequency or urgency.  He has no known history of kidney stones  Review of Systems See HPI   Past Medical History:  Diagnosis Date   ADHD (attention deficit hyperactivity disorder)    Allergic rhinitis    Asthma    Eczema    GERD (gastroesophageal reflux disease)    Headache(784.0)    Hypertension    Positive PPD, treated     Social History   Socioeconomic History   Marital status: Married    Spouse name: Not on file   Number of children: Not on file   Years of education: Not on file   Highest  education level: Not on file  Occupational History   Not on file  Tobacco Use   Smoking status: Never   Smokeless tobacco: Never  Vaping Use   Vaping status: Never Used  Substance and Sexual Activity   Alcohol use: Yes    Alcohol/week: 0.0 standard drinks of alcohol    Comment: occ   Drug use: No   Sexual activity: Yes  Other Topics Concern   Not on file  Social History Narrative   Not on file   Social Determinants of Health   Financial Resource Strain: Not on file  Food Insecurity: Not on file  Transportation Needs: Not on file  Physical Activity: Not on file  Stress: Not on file  Social Connections: Unknown (11/13/2021)   Received from [facility], [facility]   Social Network    Social Network: Not on file  Intimate Partner Violence: Unknown (10/05/2021)   Received from [facility], [facility]   HITS    Physically Hurt: Not on file    Insult or Talk Down To: Not on file    Threaten Physical Harm: Not on file    Scream or Curse: Not on file    Past Surgical History:  Procedure Laterality Date   No Surgical History     UPPER GI ENDOSCOPY  2001    Family History  Problem Relation Age of Onset  Stroke Father    Colon polyps Father    Hypertrophic cardiomyopathy Sister    Allergic rhinitis Maternal Grandmother    Coronary artery disease Other    Diabetes Other    Hyperlipidemia Other    Hypertension Other    Lung cancer Other    Stroke Other     No Known Allergies  Current Outpatient Medications on File Prior to Visit  Medication Sig Dispense Refill   amoxicillin-clavulanate (AUGMENTIN) 875-125 MG tablet Take 1 tablet by mouth 2 (two) times daily. 20 tablet 0   benzonatate (TESSALON) 100 MG capsule Take 1 capsule (100 mg total) by mouth 3 (three) times daily as needed. 30 capsule 0   Carbinoxamine Maleate (RYVENT) 6 MG TABS Take 1 tablet (6 mg total) by mouth in the morning and at bedtime. 120 tablet 5   [employer]S SUNSCREEN SPF 30 EX      EPINEPHrine  0.3 mg/0.3 mL IJ SOAJ injection Inject 0.3 mg into the muscle as needed for anaphylaxis. Bring EpiPen with you to your [facility] appointment. 0.3 mL 1   Lansoprazole (PREVACID PO) Take 10 mg by mouth.     montelukast (SINGULAIR) 10 MG tablet Take 1 tablet (10 mg total) by mouth at bedtime. 90 tablet 1   olmesartan (BENICAR) 40 MG tablet Take 1 tablet (40 mg total) by mouth daily. 90 tablet 3   promethazine-dextromethorphan (PROMETHAZINE-DM) 6.25-15 MG/5ML syrup Take 5 mLs by mouth 4 (four) times daily as needed. 118 mL 0   tobramycin-dexamethasone (TOBRADEX) ophthalmic solution Place 2 drops into both eyes every 4 (four) hours while awake. 5 mL 0   No current facility-administered medications on file prior to visit.    BP 110/70   Pulse 64   Temp 98.3 F (36.8 C) (Oral)   Ht 5\' 11"  (1.803 m)   Wt 221 lb (100.2 kg)   SpO2 96%   BMI 30.82 kg/m       Objective:   Physical Exam Vitals and nursing note reviewed.  Constitutional:      Appearance: He is well-developed.  Cardiovascular:     Rate and Rhythm: Normal rate and regular rhythm.     Pulses: Normal pulses.     Heart sounds: Normal heart sounds.  Pulmonary:     Effort: Pulmonary effort is normal.     Breath sounds: Normal breath sounds.  Abdominal:     General: Abdomen is flat. Bowel sounds are normal. There is no distension.     Palpations: Abdomen is soft. There is no mass.     Tenderness: There is no abdominal tenderness.     Hernia: No hernia is present.  Skin:    General: Skin is warm and dry.  Neurological:     General: No focal deficit present.     Mental Status: He is alert and oriented to person, place, and time.  Psychiatric:        Mood and Affect: Mood normal.        Behavior: Behavior normal.        Thought Content: Thought content normal.        Judgment: Judgment normal.        Assessment & Plan:  1. Generalized abdominal pain -Start with the labs and start him on Protonix instead of  Prevacid as well as provide Carafate tablets.  Will repeat ultrasound of the right upper quadrant to look for gallbladder disease   Will likely need to do CT of abdomen after ultrasound is  back to r/o pancreatic issue and kidney stones.  Can refer to gastroenterology if needed - Comprehensive metabolic panel; Future - CBC with Differential/Platelet; Future - US Abdomen Limited RUQ (LIVER/GB); Future - pantoprazole (PROTONIX) 40 MG tablet; Take 1 tablet (40 mg total) by mouth daily.  Dispense: 30 tablet; Refill: 3 - sucralfate (CARAFATE) 1 g tablet; Take 1 tablet (1 g total) by mouth 4 (four) times daily -  with meals and at bedtime for 10 days.  Dispense: 40 tablet; Refill: 0 - Urinalysis; Future - Amylase; Future - Amylase - Urinalysis - CBC with Differential/Platelet - Comprehensive metabolic panel   Shirline Frees, NP  Time spent with patient today was 31 minutes which consisted of chart review, discussing abdominal pain, work up, treatment answering questions and documentation.

## 2023-03-31 ENCOUNTER — Inpatient Hospital Stay
Admission: RE | Admit: 2023-03-31 | Discharge: 2023-03-31 | Discharge status: Home / Self Care | Payer: Managed Care, Other (non HMO) | Source: Ambulatory Visit | Attending: Adult Health

## 2023-03-31 DIAGNOSIS — R1084 Generalized abdominal pain: Secondary | ICD-10-CM

## 2023-04-01 ENCOUNTER — Ambulatory Visit (INDEPENDENT_AMBULATORY_CARE_PROVIDER_SITE_OTHER): Payer: Managed Care, Other (non HMO) | Admitting: *Deleted

## 2023-04-01 DIAGNOSIS — J309 Allergic rhinitis, unspecified: Secondary | ICD-10-CM

## 2023-04-02 ENCOUNTER — Encounter: Payer: Self-pay | Admitting: Adult Health

## 2023-04-02 ENCOUNTER — Ambulatory Visit (INDEPENDENT_AMBULATORY_CARE_PROVIDER_SITE_OTHER): Payer: Managed Care, Other (non HMO) | Admitting: Adult Health

## 2023-04-02 VITALS — BP 110/70 | HR 69 | Temp 98.2°F | Ht 71.0 in | Wt 221.0 lb

## 2023-04-02 DIAGNOSIS — Z0000 Encounter for general adult medical examination without abnormal findings: Secondary | ICD-10-CM

## 2023-04-02 DIAGNOSIS — J302 Other seasonal allergic rhinitis: Secondary | ICD-10-CM

## 2023-04-02 DIAGNOSIS — E785 Hyperlipidemia, unspecified: Secondary | ICD-10-CM | POA: Diagnosis not present

## 2023-04-02 DIAGNOSIS — K219 Gastro-esophageal reflux disease without esophagitis: Secondary | ICD-10-CM

## 2023-04-02 DIAGNOSIS — K760 Fatty (change of) liver, not elsewhere classified: Secondary | ICD-10-CM

## 2023-04-02 DIAGNOSIS — J4521 Mild intermittent asthma with (acute) exacerbation: Secondary | ICD-10-CM | POA: Diagnosis not present

## 2023-04-02 DIAGNOSIS — Z1211 Encounter for screening for malignant neoplasm of colon: Secondary | ICD-10-CM

## 2023-04-02 DIAGNOSIS — I10 Essential (primary) hypertension: Secondary | ICD-10-CM

## 2023-04-02 DIAGNOSIS — R6882 Decreased libido: Secondary | ICD-10-CM

## 2023-04-02 DIAGNOSIS — Z125 Encounter for screening for malignant neoplasm of prostate: Secondary | ICD-10-CM

## 2023-04-02 DIAGNOSIS — R1084 Generalized abdominal pain: Secondary | ICD-10-CM

## 2023-04-02 NOTE — Patient Instructions (Signed)
Health Maintenance Due  Topic Date Due   Colonoscopy  Never done   Zoster Vaccines- Shingrix (1 of 2) Never done   INFLUENZA VACCINE  01/30/2023   COVID-19 Vaccine (1 - 2023-24 season) Never done       10/01/2022   11:12 AM 02/01/2022    8:04 AM 01/04/2022    8:22 AM  Depression screen PHQ 2/9  Decreased Interest 1 0 0  Down, Depressed, Hopeless 0 0 1  PHQ - 2 Score 1 0 1  Altered sleeping 0 0 0  Tired, decreased energy 1 0 1  Change in appetite 1 0 1  Feeling bad or failure about yourself  1 0 1  Trouble concentrating 2 2 2   Moving slowly or fidgety/restless 0 0 1  Suicidal thoughts 0 0 0  PHQ-9 Score 6 2 7   Difficult doing work/chores Not difficult at all Not difficult at all Somewhat difficult

## 2023-04-02 NOTE — Progress Notes (Signed)
Subjective:    Patient ID: Derek Love, male    DOB: 03-18-1971, 52 y.o.   MRN: [id number]  HPI Patient presents for yearly preventative medicine examination. He is a pleasant 52 year old male who  has a past medical history of ADHD (attention deficit hyperactivity disorder), Allergic rhinitis, Asthma, Eczema, GERD (gastroesophageal reflux disease), Headache(784.0), Hypertension, and Positive PPD, treated.  HTN -managed with Benicar 40 mg daily.  He denies lightheadedness, chest pain, or shortness of breath. He has been experiencing fatigue and dizziness from time to time. He reports making extensive lifestyle changes since being placed on Benicar.  BP Readings from Last 3 Encounters:  04/02/23 110/70  03/28/23 110/70  01/10/23 128/80   Seasonal Allergies/Asthma -managed with Singulair 10 mg nightl  and will use an albuterol inhaler as needed.  GERD- managed with prevacid 15 mg daily.   Hyperlipidemia - not currently on medication Lab Results  Component Value Date   CHOL 249 (H) 05/06/2022   HDL 57.30 05/06/2022   LDLCALC 133 (H) 02/04/2022   LDLDIRECT 148.0 05/06/2022   TRIG 279.0 (H) 05/06/2022   CHOLHDL 4 05/06/2022   Abdominal Pain -he was seen late last week for 4 months of worsening abdominal pain in the left upper quadrant.  He has a history dating back multiple years of right-sided abdominal pain with known acid reflux.  In 2020 he had an ultrasound of the right upper quadrant which showed no evidence of cholecystitis but did show fatty liver disease at this time he also had an endoscopy which showed diffuse inflammation in the gastric fundus and gastric body with cobblestoning appearance and mottled with erythema.  His lab work from last week including CMP, amylase CBC, and urinalysis were within normal limits.  We repeated his ultrasound which again showed fatty liver disease but no signs of gallbladder disease.  Was placed on Protonix 40 mg daily and Carafate 4 times  daily with meals and at bedtime x 10 days. He does report today that he has had some improvement in his symptoms since starting Protonix and Carafate but still have generalzied abdominal pain throughout the day.   All immunizations and health maintenance protocols were reviewed with the patient and needed orders were placed.  Appropriate screening laboratory values were ordered for the patient including screening of hyperlipidemia, renal function and hepatic function.   Medication reconciliation,  past medical history, social history, problem list and allergies were reviewed in detail with the patient  Goals were established with regard to weight loss, exercise, and  diet in compliance with medication. He does work out often and tries to eat healthy.  Wt Readings from Last 3 Encounters:  03/28/23 221 lb (100.2 kg)  12/16/22 224 lb (101.6 kg)  12/06/22 224 lb 3.2 oz (101.7 kg)   He is overdue for colon cancer screening   He would like to retest his testosterone level today. Feels as though his sex drive has been lower.   Review of Systems  Constitutional: Negative.   HENT: Negative.    Eyes: Negative.   Respiratory: Negative.    Cardiovascular: Negative.   Gastrointestinal:  Positive for abdominal pain.  Endocrine: Negative.   Genitourinary: Negative.   Musculoskeletal: Negative.   Skin: Negative.   Allergic/Immunologic: Negative.   Neurological: Negative.   Hematological: Negative.   Psychiatric/Behavioral: Negative.    All other systems reviewed and are negative.  Past Medical History:  Diagnosis Date   ADHD (attention deficit hyperactivity disorder)  Allergic rhinitis    Asthma    Eczema    GERD (gastroesophageal reflux disease)    Headache(784.0)    Hypertension    Positive PPD, treated     Social History   Socioeconomic History   Marital status: Married    Spouse name: Not on file   Number of children: Not on file   Years of education: Not on file    Highest education level: Not on file  Occupational History   Not on file  Tobacco Use   Smoking status: Never   Smokeless tobacco: Never  Vaping Use   Vaping status: Never Used  Substance and Sexual Activity   Alcohol use: Yes    Alcohol/week: 0.0 standard drinks of alcohol    Comment: occ   Drug use: No   Sexual activity: Yes  Other Topics Concern   Not on file  Social History Narrative   Not on file   Social Determinants of Health   Financial Resource Strain: Not on file  Food Insecurity: Not on file  Transportation Needs: Not on file  Physical Activity: Not on file  Stress: Not on file  Social Connections: Unknown (11/13/2021)   Received from [facility], [facility]   Social Network    Social Network: Not on file  Intimate Partner Violence: Unknown (10/05/2021)   Received from [employer], [facility]   HITS    Physically Hurt: Not on file    Insult or Talk Down To: Not on file    Threaten Physical Harm: Not on file    Scream or Curse: Not on file    Past Surgical History:  Procedure Laterality Date   No Surgical History     UPPER GI ENDOSCOPY  2001    Family History  Problem Relation Age of Onset   Stroke Father    Colon polyps Father    Hypertrophic cardiomyopathy Sister    Allergic rhinitis Maternal Grandmother    Coronary artery disease Other    Diabetes Other    Hyperlipidemia Other    Hypertension Other    Lung cancer Other    Stroke Other     No Known Allergies  Current Outpatient Medications on File Prior to Visit  Medication Sig Dispense Refill   amoxicillin-clavulanate (AUGMENTIN) 875-125 MG tablet Take 1 tablet by mouth 2 (two) times daily. 20 tablet 0   benzonatate (TESSALON) 100 MG capsule Take 1 capsule (100 mg total) by mouth 3 (three) times daily as needed. 30 capsule 0   Carbinoxamine Maleate (RYVENT) 6 MG TABS Take 1 tablet (6 mg total) by mouth in the morning and at bedtime. 120 tablet 5   CVS SUNSCREEN SPF 30 EX       EPINEPHrine 0.3 mg/0.3 mL IJ SOAJ injection Inject 0.3 mg into the muscle as needed for anaphylaxis. Bring EpiPen with you to your [facility] appointment. 0.3 mL 1   Lansoprazole (PREVACID PO) Take 10 mg by mouth.     montelukast (SINGULAIR) 10 MG tablet Take 1 tablet (10 mg total) by mouth at bedtime. 90 tablet 1   olmesartan (BENICAR) 40 MG tablet Take 1 tablet (40 mg total) by mouth daily. 90 tablet 3   pantoprazole (PROTONIX) 40 MG tablet Take 1 tablet (40 mg total) by mouth daily. 30 tablet 3   promethazine-dextromethorphan (PROMETHAZINE-DM) 6.25-15 MG/5ML syrup Take 5 mLs by mouth 4 (four) times daily as needed. 118 mL 0   sucralfate (CARAFATE) 1 g tablet Take 1 tablet (  1 g total) by mouth 4 (four) times daily -  with meals and at bedtime for 10 days. 40 tablet 0   tobramycin-dexamethasone (TOBRADEX) ophthalmic solution Place 2 drops into both eyes every 4 (four) hours while awake. 5 mL 0   No current facility-administered medications on file prior to visit.    There were no vitals taken for this visit.      Objective:   Physical Exam Vitals and nursing note reviewed.  Constitutional:      General: He is not in acute distress.    Appearance: Normal appearance. He is not ill-appearing.  HENT:     Head: Normocephalic and atraumatic.     Right Ear: Tympanic membrane, ear canal and external ear normal. There is no impacted cerumen.     Left Ear: Tympanic membrane, ear canal and external ear normal. There is no impacted cerumen.     Nose: Nose normal. No congestion or rhinorrhea.     Mouth/Throat:     Mouth: Mucous membranes are moist.     Pharynx: Oropharynx is clear.  Eyes:     Extraocular Movements: Extraocular movements intact.     Conjunctiva/sclera: Conjunctivae normal.     Pupils: Pupils are equal, round, and reactive to light.  Neck:     Vascular: No carotid bruit.  Cardiovascular:     Rate and Rhythm: Normal rate and regular rhythm.     Pulses: Normal pulses.      Heart sounds: No murmur heard.    No friction rub. No gallop.  Pulmonary:     Effort: Pulmonary effort is normal.     Breath sounds: Normal breath sounds.  Abdominal:     General: Abdomen is flat. Bowel sounds are normal. There is no distension.     Palpations: Abdomen is soft. There is no mass.     Tenderness: There is no abdominal tenderness. There is no guarding or rebound.     Hernia: No hernia is present.  Musculoskeletal:        General: Normal range of motion.     Cervical back: Normal range of motion and neck supple.  Lymphadenopathy:     Cervical: No cervical adenopathy.  Skin:    General: Skin is warm and dry.     Capillary Refill: Capillary refill takes less than 2 seconds.  Neurological:     General: No focal deficit present.     Mental Status: He is alert and oriented to person, place, and time.  Psychiatric:        Mood and Affect: Mood normal.        Behavior: Behavior normal.        Thought Content: Thought content normal.        Judgment: Judgment normal.           Assessment & Plan:  1. Routine general medical examination at a health care facility Today patient counseled on age appropriate routine health concerns for screening and prevention, each reviewed and up to date or declined. Immunizations reviewed and up to date or declined. Labs ordered and reviewed. Risk factors for depression reviewed and negative. Hearing function and visual acuity are intact. ADLs screened and addressed as needed. Functional ability and level of safety reviewed and appropriate. Education, counseling and referrals performed based on assessed risks today. Patient provided with a copy of personalized plan for preventive services. - Work on weight loss and healthy eating  - Follow up in one year or sooner if needed  2. Essential hypertension - Well controlled. No change in medication  - Lipid panel; Future - TSH; Future  3. Hyperlipidemia, unspecified hyperlipidemia type -  Consider statin  - Lipid panel; Future - TSH; Future  4. Mild intermittent asthma with acute exacerbation - Per allergy clinic   5. Seasonal allergic rhinitis, unspecified trigger - Per allergy clinic   6. Gastroesophageal reflux disease, unspecified whether esophagitis present - Continue PPI   7. Prostate cancer screening  - PSA; Future  8. Colon cancer screening  - Ambulatory referral to Gastroenterology  9. Generalized abdominal pain - Continue with Protonix and Carafate - Consider referral to GI for endoscopy  - CT ABDOMEN PELVIS W CONTRAST; Future  10. Decreased libido  - Testosterone Total,Free,Bio, Males; Future  11. Fatty liver disease, nonalcoholic - Encouraged healthier eating with less red meats, cut back on alcohol if drinking in excess.   Shirline Frees, NP

## 2023-04-07 ENCOUNTER — Ambulatory Visit (INDEPENDENT_AMBULATORY_CARE_PROVIDER_SITE_OTHER): Payer: Managed Care, Other (non HMO)

## 2023-04-07 DIAGNOSIS — J309 Allergic rhinitis, unspecified: Secondary | ICD-10-CM | POA: Diagnosis not present

## 2023-04-08 ENCOUNTER — Other Ambulatory Visit (INDEPENDENT_AMBULATORY_CARE_PROVIDER_SITE_OTHER): Payer: Managed Care, Other (non HMO)

## 2023-04-08 ENCOUNTER — Ambulatory Visit (HOSPITAL_BASED_OUTPATIENT_CLINIC_OR_DEPARTMENT_OTHER)
Admission: RE | Admit: 2023-04-08 | Discharge: 2023-04-08 | Disposition: A | Discharge status: Home / Self Care | Payer: Managed Care, Other (non HMO) | Source: Ambulatory Visit | Attending: Adult Health | Admitting: Adult Health

## 2023-04-08 DIAGNOSIS — R1084 Generalized abdominal pain: Secondary | ICD-10-CM | POA: Insufficient documentation

## 2023-04-08 DIAGNOSIS — Z125 Encounter for screening for malignant neoplasm of prostate: Secondary | ICD-10-CM | POA: Diagnosis not present

## 2023-04-08 DIAGNOSIS — E785 Hyperlipidemia, unspecified: Secondary | ICD-10-CM

## 2023-04-08 DIAGNOSIS — I10 Essential (primary) hypertension: Secondary | ICD-10-CM

## 2023-04-08 DIAGNOSIS — R6882 Decreased libido: Secondary | ICD-10-CM

## 2023-04-08 LAB — LIPID PANEL
Cholesterol: 233 mg/dL — ABNORMAL HIGH (ref 0–200)
HDL: 58.1 mg/dL (ref >39.00)
LDL Cholesterol: 159 mg/dL — ABNORMAL HIGH (ref 0–99)
NonHDL: 175.38
Total CHOL/HDL Ratio: 4
Triglycerides: 80 mg/dL (ref 0.0–149.0)
VLDL: 16 mg/dL (ref 0.0–40.0)

## 2023-04-08 LAB — POCT I-STAT CREATININE: Creatinine, Ser: 1.3 mg/dL — ABNORMAL HIGH (ref 0.61–1.24)

## 2023-04-08 LAB — PSA: PSA: 0.29 ng/mL (ref 0.10–4.00)

## 2023-04-08 LAB — TSH: TSH: 1.79 u[IU]/mL (ref 0.35–5.50)

## 2023-04-08 MED ORDER — IOHEXOL 300 MG/ML  SOLN
100.0000 mL | Freq: Once | INTRAMUSCULAR | Status: AC | PRN
  Administered 2023-04-08: 100 mL via INTRAVENOUS

## 2023-04-09 ENCOUNTER — Other Ambulatory Visit: Payer: Self-pay

## 2023-04-09 LAB — TESTOSTERONE TOTAL,FREE,BIO, MALES
Albumin: 4.6 g/dL (ref 3.6–5.1)
Sex Hormone Binding: 22 nmol/L (ref 10–50)
Testosterone, Bioavailable: 165.6 ng/dL (ref 110.0–575.0)
Testosterone, Free: 78.8 pg/mL (ref 46.0–224.0)
Testosterone: 438 ng/dL (ref 250–827)

## 2023-04-09 MED ORDER — ATORVASTATIN CALCIUM 10 MG PO TABS: 10.0000 mg | ORAL_TABLET | Freq: Every day | ORAL | 2 refills | Status: DC

## 2023-04-10 ENCOUNTER — Encounter: Payer: Self-pay | Admitting: Nurse Practitioner

## 2023-04-14 ENCOUNTER — Ambulatory Visit (INDEPENDENT_AMBULATORY_CARE_PROVIDER_SITE_OTHER): Payer: Managed Care, Other (non HMO)

## 2023-04-14 DIAGNOSIS — J309 Allergic rhinitis, unspecified: Secondary | ICD-10-CM

## 2023-04-22 ENCOUNTER — Encounter: Payer: Self-pay | Admitting: Adult Health

## 2023-04-22 ENCOUNTER — Other Ambulatory Visit: Payer: Self-pay | Admitting: Adult Health

## 2023-04-22 ENCOUNTER — Ambulatory Visit (INDEPENDENT_AMBULATORY_CARE_PROVIDER_SITE_OTHER): Payer: Self-pay | Admitting: *Deleted

## 2023-04-22 DIAGNOSIS — E785 Hyperlipidemia, unspecified: Secondary | ICD-10-CM

## 2023-04-22 DIAGNOSIS — J309 Allergic rhinitis, unspecified: Secondary | ICD-10-CM

## 2023-04-22 NOTE — Telephone Encounter (Signed)
FYI

## 2023-04-28 ENCOUNTER — Ambulatory Visit (INDEPENDENT_AMBULATORY_CARE_PROVIDER_SITE_OTHER): Payer: Self-pay

## 2023-04-28 DIAGNOSIS — J309 Allergic rhinitis, unspecified: Secondary | ICD-10-CM | POA: Diagnosis not present

## 2023-05-05 ENCOUNTER — Ambulatory Visit (INDEPENDENT_AMBULATORY_CARE_PROVIDER_SITE_OTHER): Payer: Managed Care, Other (non HMO)

## 2023-05-05 DIAGNOSIS — J309 Allergic rhinitis, unspecified: Secondary | ICD-10-CM

## 2023-05-12 ENCOUNTER — Ambulatory Visit (INDEPENDENT_AMBULATORY_CARE_PROVIDER_SITE_OTHER): Payer: Managed Care, Other (non HMO)

## 2023-05-12 DIAGNOSIS — J309 Allergic rhinitis, unspecified: Secondary | ICD-10-CM | POA: Diagnosis not present

## 2023-05-14 ENCOUNTER — Telehealth: Payer: Managed Care, Other (non HMO) | Admitting: Emergency Medicine

## 2023-05-14 DIAGNOSIS — M545 Low back pain, unspecified: Secondary | ICD-10-CM | POA: Diagnosis not present

## 2023-05-14 MED ORDER — CYCLOBENZAPRINE HCL 10 MG PO TABS: 10.0000 mg | ORAL_TABLET | Freq: Three times a day (TID) | ORAL | 0 refills | Status: DC | PRN

## 2023-05-14 MED ORDER — NAPROXEN 500 MG PO TABS: 500.0000 mg | ORAL_TABLET | Freq: Two times a day (BID) | ORAL | 0 refills | Status: DC

## 2023-05-14 NOTE — Progress Notes (Signed)
E-Visit for Back Pain   We are sorry that you are not feeling well.  Here is how we plan to help!  Based on what you have shared with me it looks like you mostly have acute back pain.  Acute back pain is defined as musculoskeletal pain that can resolve in 1-3 weeks with conservative treatment.  I have prescribed Naprosyn 500 mg take one by mouth twice a day non-steroid anti-inflammatory (NSAID) as well as Flexeril 10 mg every eight hours as needed which is a muscle relaxer  Some patients experience stomach irritation or in increased heartburn with anti-inflammatory drugs.  Please keep in mind that muscle relaxer's can cause fatigue and should not be taken while at work or driving.  Back pain is very common.  The pain often gets better over time.  The cause of back pain is usually not dangerous.  Most people can learn to manage their back pain on their own.  Home Care Stay active.  Start with short walks on flat ground if you can.  Try to walk farther each day. Do not sit, drive or stand in one place for more than 30 minutes.  Do not stay in bed. Do not avoid exercise or work.  Activity can help your back heal faster. Be careful when you bend or lift an object.  Bend at your knees, keep the object close to you, and do not twist. Sleep on a firm mattress.  Lie on your side, and bend your knees.  If you lie on your back, put a pillow under your knees. Only take medicines as told by your doctor. Put ice on the injured area. Put ice in a plastic bag Place a towel between your skin and the bag Leave the ice on for 15-20 minutes, 3-4 times a day for the first 2-3 days. 210 After that, you can switch between ice and heat packs. Ask your doctor about back exercises or massage. Avoid feeling anxious or stressed.  Find good ways to deal with stress, such as exercise.  Get Help Right Way If: Your pain does not go away with rest or medicine. Your pain does not go away in 1 week. You have new  problems. You do not feel well. The pain spreads into your legs. You cannot control when you poop (bowel movement) or pee (urinate) You feel sick to your stomach (nauseous) or throw up (vomit) You have belly (abdominal) pain. You feel like you may pass out (faint). If you develop a fever.  Make Sure you: Understand these instructions. Will watch your condition Will get help right away if you are not doing well or get worse.  Your e-visit answers were reviewed by a board certified advanced clinical practitioner to complete your personal care plan.  Depending on the condition, your plan could have included both over the counter or prescription medications.  If there is a problem please reply  once you have received a response from your provider.  Your safety is important to [place].  If you have drug allergies check your prescription carefully.    You can use MyChart to ask questions about today's visit, request a non-urgent call back, or ask for a work or school excuse for 24 hours related to this e-Visit. If it has been greater than 24 hours you will need to follow up with your provider, or enter a new e-Visit to address those concerns.  You will get an e-mail in the next two days asking about  your experience.  I hope that your e-visit has been valuable and will speed your recovery. Thank you for using e-visits.   Approximately 5 minutes was used in reviewing the patient's chart, questionnaire, prescribing medications, and documentation.

## 2023-05-16 ENCOUNTER — Ambulatory Visit (HOSPITAL_BASED_OUTPATIENT_CLINIC_OR_DEPARTMENT_OTHER)
Admission: RE | Admit: 2023-05-16 | Discharge: 2023-05-16 | Disposition: A | Discharge status: Home / Self Care | Payer: Managed Care, Other (non HMO) | Source: Ambulatory Visit | Attending: Adult Health | Admitting: Adult Health

## 2023-05-16 DIAGNOSIS — E785 Hyperlipidemia, unspecified: Secondary | ICD-10-CM | POA: Insufficient documentation

## 2023-05-19 ENCOUNTER — Ambulatory Visit (INDEPENDENT_AMBULATORY_CARE_PROVIDER_SITE_OTHER): Payer: Self-pay

## 2023-05-19 DIAGNOSIS — J309 Allergic rhinitis, unspecified: Secondary | ICD-10-CM

## 2023-05-20 ENCOUNTER — Other Ambulatory Visit: Payer: Self-pay | Admitting: Adult Health

## 2023-05-20 ENCOUNTER — Telehealth: Payer: Self-pay | Admitting: Cardiology

## 2023-05-20 ENCOUNTER — Encounter: Payer: Self-pay | Admitting: Adult Health

## 2023-05-20 DIAGNOSIS — E785 Hyperlipidemia, unspecified: Secondary | ICD-10-CM

## 2023-05-20 MED ORDER — ROSUVASTATIN CALCIUM 40 MG PO TABS
40.0000 mg | ORAL_TABLET | Freq: Every day | ORAL | 3 refills | Status: DC
Start: 2023-05-20 — End: 2024-05-24

## 2023-05-20 NOTE — Telephone Encounter (Signed)
   Pt c/o of Chest Pain: STAT if active CP, including tightness, pressure, jaw pain, radiating pain to shoulder/upper arm/back, CP unrelieved by Nitro. Symptoms reported of SOB, nausea, vomiting, sweating.  1. Are you having CP right now? Burning sensation, not painful but bothersome    2. Are you experiencing any other symptoms (ex. SOB, nausea, vomiting, sweating)? Nausea about a month ago   3. Is your CP continuous or coming and going? Coming and going    4. Have you taken Nitroglycerin? No   5. How long have you been experiencing CP? 3 months    6. If NO CP at time of call then end call with telling Pt to call back or call 911 if Chest pain returns prior to return call from triage team.

## 2023-05-20 NOTE — Telephone Encounter (Signed)
Please advise 

## 2023-05-20 NOTE — Telephone Encounter (Signed)
Can we change the location on this referral? Please advise

## 2023-05-20 NOTE — Telephone Encounter (Signed)
Spoke with pt, he stated that he saw a physician and he a calcium score completed. It was 572. He is also having some "burning in his chest" and is wanting to make an appt with a cardiologist in [place] since he lives there. Message sent to front desk for appt. Pt agreed and verbalized understanding.

## 2023-05-21 ENCOUNTER — Encounter: Payer: Self-pay | Admitting: Adult Health

## 2023-05-21 ENCOUNTER — Ambulatory Visit (INDEPENDENT_AMBULATORY_CARE_PROVIDER_SITE_OTHER): Payer: Managed Care, Other (non HMO) | Admitting: Adult Health

## 2023-05-21 VITALS — BP 110/80 | HR 73 | Temp 98.3°F | Ht 71.0 in | Wt 221.0 lb

## 2023-05-21 DIAGNOSIS — I2510 Atherosclerotic heart disease of native coronary artery without angina pectoris: Secondary | ICD-10-CM

## 2023-05-21 NOTE — Patient Instructions (Signed)
Health Maintenance Due  Topic Date Due   Colonoscopy  Never done   Zoster Vaccines- Shingrix (1 of 2) Never done   INFLUENZA VACCINE  01/30/2023   COVID-19 Vaccine (1 - 2023-24 season) Never done       10/01/2022   11:12 AM 02/01/2022    8:04 AM 01/04/2022    8:22 AM  Depression screen PHQ 2/9  Decreased Interest 1 0 0  Down, Depressed, Hopeless 0 0 1  PHQ - 2 Score 1 0 1  Altered sleeping 0 0 0  Tired, decreased energy 1 0 1  Change in appetite 1 0 1  Feeling bad or failure about yourself  1 0 1  Trouble concentrating 2 2 2   Moving slowly or fidgety/restless 0 0 1  Suicidal thoughts 0 0 0  PHQ-9 Score 6 2 7   Difficult doing work/chores Not difficult at all Not difficult at all Somewhat difficult

## 2023-05-21 NOTE — Progress Notes (Signed)
Subjective:    Patient ID: Derek Love, male    DOB: Jan 22, 1971, 52 y.o.   MRN: [id number]  HPI 52 year old male who  has a past medical history of ADHD (attention deficit hyperactivity disorder), Allergic rhinitis, Asthma, Eczema, GERD (gastroesophageal reflux disease), Headache(784.0), Hypertension, and Positive PPD, treated.  He presents to the office today for follow-up regarding abnormal CT calcium scoring.  He had CT calcium scoring done about 5 days ago which showed a score of 573 which was 99 percentile for age race and sex matched controls.  His left anterior descending artery had a score of 453, left circumflex artery score of 10, right coronary artery score of 109.  Started on Lipitor 40 mg daily and referred to cardiology.  He has some questions about the scoring and what he should and should not do.  He reports that he has started eating healthier, in the past his diet was quite poor consisting of foods higher in fat, sodium, and sugar.  Does exercise on a routine basis and enjoys doing CrossFit and weightlifting.  He has not had any chest pain or shortness of breath.  Does report a strong family history of heart disease.   Review of Systems See HPI   Past Medical History:  Diagnosis Date   ADHD (attention deficit hyperactivity disorder)    Allergic rhinitis    Asthma    Eczema    GERD (gastroesophageal reflux disease)    Headache(784.0)    Hypertension    Positive PPD, treated     Social History   Socioeconomic History   Marital status: Married    Spouse name: Not on file   Number of children: Not on file   Years of education: Not on file   Highest education level: Not on file  Occupational History   Not on file  Tobacco Use   Smoking status: Never   Smokeless tobacco: Never  Vaping Use   Vaping status: Never Used  Substance and Sexual Activity   Alcohol use: Yes    Alcohol/week: 0.0 standard drinks of alcohol    Comment: occ   Drug use: No    Sexual activity: Yes  Other Topics Concern   Not on file  Social History Narrative   Not on file   Social Determinants of Health   Financial Resource Strain: Not on file  Food Insecurity: Not on file  Transportation Needs: Not on file  Physical Activity: Not on file  Stress: Not on file  Social Connections: Unknown (11/13/2021)   Received from [facility], [facility]   Social Network    Social Network: Not on file  Intimate Partner Violence: Unknown (10/05/2021)   Received from [facility], [facility]   HITS    Physically Hurt: Not on file    Insult or Talk Down To: Not on file    Threaten Physical Harm: Not on file    Scream or Curse: Not on file    Past Surgical History:  Procedure Laterality Date   No Surgical History     UPPER GI ENDOSCOPY  2001    Family History  Problem Relation Age of Onset   Stroke Father    Colon polyps Father    Hypertrophic cardiomyopathy Sister    Allergic rhinitis Maternal Grandmother    Coronary artery disease Other    Diabetes Other    Hyperlipidemia Other    Hypertension Other    Lung cancer Other    Stroke Other  No Known Allergies  Current Outpatient Medications on File Prior to Visit  Medication Sig Dispense Refill   aspirin EC 81 MG tablet Take by mouth.     Carbinoxamine Maleate (RYVENT) 6 MG TABS Take 1 tablet (6 mg total) by mouth in the morning and at bedtime. 120 tablet 5   montelukast (SINGULAIR) 10 MG tablet Take 1 tablet (10 mg total) by mouth at bedtime. 90 tablet 1   olmesartan (BENICAR) 40 MG tablet Take 1 tablet (40 mg total) by mouth daily. 90 tablet 3   pantoprazole (PROTONIX) 40 MG tablet Take 1 tablet (40 mg total) by mouth daily. 30 tablet 3   rosuvastatin (CRESTOR) 40 MG tablet Take 1 tablet (40 mg total) by mouth daily. 90 tablet 3   No current facility-administered medications on file prior to visit.    BP 110/80   Pulse 73   Temp 98.3 F (36.8 C) (Oral)   Ht 5\' 11"  (1.803 m)   Wt 221  lb (100.2 kg)   SpO2 95%   BMI 30.82 kg/m       Objective:   Physical Exam Vitals and nursing note reviewed.  Constitutional:      Appearance: Normal appearance.  Cardiovascular:     Rate and Rhythm: Normal rate and regular rhythm.     Pulses: Normal pulses.     Heart sounds: Normal heart sounds.  Pulmonary:     Effort: Pulmonary effort is normal.     Breath sounds: Normal breath sounds.  Musculoskeletal:        General: Normal range of motion.  Skin:    General: Skin is warm and dry.  Neurological:     General: No focal deficit present.     Mental Status: He is alert and oriented to person, place, and time.  Psychiatric:        Mood and Affect: Mood normal.        Behavior: Behavior normal.        Thought Content: Thought content normal.        Judgment: Judgment normal.       Assessment & Plan:  1. Coronary artery disease involving native coronary artery of native heart without angina pectoris -Questions answered to the best of my ability.  He was encouraged to start eating a Mediterranean diet.  He can continue to exercise but would refrain from high intensity training until seen by cardiology.  Continue with Lipitor 40 mg daily.  -Referral to cardiology accidentally got sent to [facility], this has been resolved and the referral has been transferred to [facility] since he lives in [place].  Shirline Frees, NP  Time spent with patient today was 32 minutes which consisted of chart review, discussing CAD work up, treatment answering questions and documentation.

## 2023-05-21 NOTE — Telephone Encounter (Signed)
Done

## 2023-05-26 ENCOUNTER — Encounter: Payer: Self-pay | Admitting: Adult Health

## 2023-05-26 ENCOUNTER — Ambulatory Visit (INDEPENDENT_AMBULATORY_CARE_PROVIDER_SITE_OTHER): Payer: Self-pay

## 2023-05-26 DIAGNOSIS — J309 Allergic rhinitis, unspecified: Secondary | ICD-10-CM

## 2023-05-27 NOTE — Telephone Encounter (Signed)
Pleas advise which cardio office on [address] Pt was referred to.

## 2023-05-28 ENCOUNTER — Other Ambulatory Visit: Payer: Self-pay

## 2023-05-28 ENCOUNTER — Encounter: Payer: Self-pay | Admitting: Allergy

## 2023-05-28 ENCOUNTER — Ambulatory Visit: Payer: Managed Care, Other (non HMO) | Admitting: Allergy

## 2023-05-28 VITALS — BP 106/66 | HR 66 | Temp 98.8°F | Resp 20

## 2023-05-28 DIAGNOSIS — H1013 Acute atopic conjunctivitis, bilateral: Secondary | ICD-10-CM | POA: Diagnosis not present

## 2023-05-28 DIAGNOSIS — J4520 Mild intermittent asthma, uncomplicated: Secondary | ICD-10-CM | POA: Diagnosis not present

## 2023-05-28 DIAGNOSIS — L239 Allergic contact dermatitis, unspecified cause: Secondary | ICD-10-CM | POA: Diagnosis not present

## 2023-05-28 DIAGNOSIS — J3089 Other allergic rhinitis: Secondary | ICD-10-CM

## 2023-05-28 MED ORDER — LASTACAFT 0.25 % OP SOLN: 1.0000 [drp] | Freq: Two times a day (BID) | OPHTHALMIC | 5 refills | Status: DC | PRN

## 2023-05-28 MED ORDER — RYALTRIS 665-25 MCG/ACT NA SUSP: 2.0000 | Freq: Two times a day (BID) | NASAL | 5 refills | Status: DC

## 2023-05-28 MED ORDER — CLOBETASOL PROPIONATE 0.05 % EX OINT: 1.0000 | TOPICAL_OINTMENT | Freq: Two times a day (BID) | CUTANEOUS | 5 refills | Status: AC | PRN

## 2023-05-28 NOTE — Progress Notes (Signed)
Follow-up Note  RE: Derek Love MRN: [id number] DOB: 11-28-1970 Date of Office Visit: 05/28/2023   History of present illness: Derek Love is a 52 y.o. male presenting today for follow-up of allergic rhinitis with conjunctivitis, intermittent asthma and allergic urticaria.  He was last seen in the office on 01/10/2023 by myself for rush immunotherapy desensitization.  Discussed the use of AI scribe software for clinical note transcription with the patient, who gave verbal consent to proceed.  He has been undergoing allergen immunotherapy and reports significant improvement in symptoms already. He has successfully reached the maintenance phase of his allergy shots, with no adverse reactions noted. He reports a noticeable difference in his symptoms, even though the spring pollen season has not yet begun.  He does have access to an epinephrine device to have on days of his allergy shots.  The patient is also on Singulair and carbinoxamine, which he reports as effective in managing his symptoms.  He also has elected Eyedrops for as needed use.  He also uses a Ryaltris nasal spray, which he reports as effective. He has been using it sparingly, once in the morning and once at night, to avoid headaches. He still has some of the spray left from a previous sample provided by the clinic.  He has not needed to use his albuterol inhaler recently.  He has experienced some skin irritation on the areas of his hand that come in contact with his mouse and computer desk, which he has been managing with clobetasol cream with resolution.  Review of systems: 10pt ROS negative unless noted above in HPI  All other systems negative unless noted above in HPI  Past medical/social/surgical/family history have been reviewed and are unchanged unless specifically indicated below.  No changes  Medication List: Current Outpatient Medications  Medication Sig Dispense Refill   Alcaftadine (LASTACAFT) 0.25 %  SOLN Apply 1 drop to eye 2 (two) times daily as needed (itchy, water eyes). 5 mL 5   aspirin EC 81 MG tablet Take by mouth.     Carbinoxamine Maleate (RYVENT) 6 MG TABS Take 1 tablet (6 mg total) by mouth in the morning and at bedtime. 120 tablet 5   clobetasol ointment (TEMOVATE) 0.05 % Apply 1 Application topically 2 (two) times daily as needed (itchy rash). 60 g 5   montelukast (SINGULAIR) 10 MG tablet Take 1 tablet (10 mg total) by mouth at bedtime. 90 tablet 1   olmesartan (BENICAR) 40 MG tablet Take 1 tablet (40 mg total) by mouth daily. 90 tablet 3   Olopatadine-Mometasone (RYALTRIS) 665-25 MCG/ACT SUSP Place 2 sprays into the nose 2 (two) times daily. 29 g 5   pantoprazole (PROTONIX) 40 MG tablet Take 1 tablet (40 mg total) by mouth daily. 30 tablet 3   rosuvastatin (CRESTOR) 40 MG tablet Take 1 tablet (40 mg total) by mouth daily. 90 tablet 3   No current facility-administered medications for this visit.     Known medication allergies: No Known Allergies   Physical examination: Blood pressure 106/66, pulse 66, temperature 98.8 F (37.1 C), temperature source Temporal, resp. rate 20, SpO2 97%.  General: Alert, interactive, in no acute distress. HEENT: PERRLA, TMs pearly gray, turbinates minimally edematous without discharge, post-pharynx non erythematous. Neck: Supple without lymphadenopathy. Lungs: Clear to auscultation without wheezing, rhonchi or rales. {no increased work of breathing. CV: Normal S1, S2 without murmurs. Abdomen: Nondistended, nontender. Skin: Warm and dry, without lesions or rashes. Extremities:  No clubbing, cyanosis or edema. Neuro:  Grossly intact.  Diagnositics/Labs:  Spirometry: FEV1: 3.7L 95%, FVC: 4.52L 92%, ratio consistent with nonobstructive pattern  Assessment and plan: Allergic rhinitis with conjunctivitis - Continue allergen avoidance measures - Continue with:  Singulair (montelukast) 10mg  daily Ryvent (carbinoxamine) 6mg  tablet 2  times daily.  This is an antihistamine.  Ryaltris (olopatadine/mometasone) two sprays per nostril 2 times daily for congestion and/or drainage.  This is a combination nasal spray.   Lastacaft 1 drop each eye up to twice a day as needed for itchy/watery eyes.   - Perform nasal saline rinses daily to remove allergens from the nasal cavities as well as help with mucous clearance (this is especially helpful to do before the nasal sprays are given) - Continue allergy shots per schedule.  You are at maintenance dosing!  Have access to epipen device on your allergy shot days.   Mild intermittent reactive airway - Lung function testing is normal today! - Daily controller medication(s): Singulair 10mg  daily - Prior to physical activity if needed: albuterol 2 puffs 10-15 minutes before physical activity. - Rescue medications: albuterol 2 puffs every 4-6 hours as needed  - Breathing control goals:  * Full participation in all desired activities (may need albuterol before activity) * Albuterol use two time or less a week on average (not counting use with activity) * Cough interfering with sleep two time or less a month * Oral steroids no more than once a year * No hospitalizations  Allergic dermatitis - For itchy rash on hand use clobetasol ointment twice a day as needed.  Do not use on face, armpit or genitalia areas.   Follow-up in 12 months or sooner if needed  I appreciate the opportunity to take part in Derek Love's care. Please do not hesitate to contact me with questions.  Sincerely,   Margo Aye, MD Allergy/Immunology [facility]

## 2023-05-28 NOTE — Addendum Note (Signed)
Addended by: Florence Canner on: 05/28/2023 04:20 PM   Modules accepted: Orders

## 2023-05-28 NOTE — Patient Instructions (Addendum)
-   Continue allergen avoidance measures - Continue with:  Singulair (montelukast) 10mg  daily Ryvent (carbinoxamine) 6mg  tablet 2 times daily.  This is an antihistamine.  Ryaltris (olopatadine/mometasone) two sprays per nostril 2 times daily for congestion and/or drainage.  This is a combination nasal spray.   Lastacaft 1 drop each eye up to twice a day as needed for itchy/watery eyes.   - Perform nasal saline rinses daily to remove allergens from the nasal cavities as well as help with mucous clearance (this is especially helpful to do before the nasal sprays are given) - Continue allergy shots per schedule.  You are at maintenance dosing!  Have access to epipen device on your allergy shot days.   - Lung function testing is normal today! - Daily controller medication(s): Singulair 10mg  daily - Prior to physical activity if needed: albuterol 2 puffs 10-15 minutes before physical activity. - Rescue medications: albuterol 2 puffs every 4-6 hours as needed  - Breathing control goals:  * Full participation in all desired activities (may need albuterol before activity) * Albuterol use two time or less a week on average (not counting use with activity) * Cough interfering with sleep two time or less a month * Oral steroids no more than once a year * No hospitalizations  - For itchy rash on hand use clobetasol ointment twice a day as needed.  Do not use on face, armpit or genitalia areas.   Follow-up in 12 months or sooner if needed

## 2023-06-09 ENCOUNTER — Ambulatory Visit (INDEPENDENT_AMBULATORY_CARE_PROVIDER_SITE_OTHER): Payer: Self-pay

## 2023-06-09 DIAGNOSIS — J309 Allergic rhinitis, unspecified: Secondary | ICD-10-CM

## 2023-06-10 DIAGNOSIS — J301 Allergic rhinitis due to pollen: Secondary | ICD-10-CM | POA: Diagnosis not present

## 2023-06-10 NOTE — Progress Notes (Signed)
VIAL EXP 06-09-24

## 2023-06-23 ENCOUNTER — Ambulatory Visit (INDEPENDENT_AMBULATORY_CARE_PROVIDER_SITE_OTHER): Payer: Self-pay

## 2023-06-23 DIAGNOSIS — J309 Allergic rhinitis, unspecified: Secondary | ICD-10-CM

## 2023-07-08 ENCOUNTER — Ambulatory Visit (INDEPENDENT_AMBULATORY_CARE_PROVIDER_SITE_OTHER): Payer: Managed Care, Other (non HMO) | Admitting: *Deleted

## 2023-07-08 DIAGNOSIS — J309 Allergic rhinitis, unspecified: Secondary | ICD-10-CM

## 2023-07-15 ENCOUNTER — Ambulatory Visit: Payer: Managed Care, Other (non HMO) | Admitting: Nurse Practitioner

## 2023-07-16 ENCOUNTER — Ambulatory Visit: Payer: Managed Care, Other (non HMO) | Admitting: Cardiology

## 2023-07-21 ENCOUNTER — Ambulatory Visit (INDEPENDENT_AMBULATORY_CARE_PROVIDER_SITE_OTHER): Payer: Self-pay

## 2023-07-21 DIAGNOSIS — J309 Allergic rhinitis, unspecified: Secondary | ICD-10-CM | POA: Diagnosis not present

## 2023-07-28 ENCOUNTER — Ambulatory Visit (INDEPENDENT_AMBULATORY_CARE_PROVIDER_SITE_OTHER): Payer: Self-pay

## 2023-07-28 DIAGNOSIS — J309 Allergic rhinitis, unspecified: Secondary | ICD-10-CM | POA: Diagnosis not present

## 2023-08-05 ENCOUNTER — Ambulatory Visit: Payer: Managed Care, Other (non HMO) | Admitting: Nurse Practitioner

## 2023-08-05 ENCOUNTER — Ambulatory Visit: Payer: Managed Care, Other (non HMO) | Admitting: Cardiology

## 2023-08-05 NOTE — Progress Notes (Signed)
 Cardiology Office Note:  .   Date:  08/06/2023  ID:  Derek Love, DOB [birth year], MRN [id number] PCP: Derek Huxley, NP   HeartCare Providers Cardiologist:  Derek ONEIDA Decent, MD { History of Present Illness: .   Derek Love is a 52 y.o. male with history of CAD who presents for the evaluation of CAD at the request of Derek Huxley, NP.  Chief Complaint  Patient presents with   New Patient (Initial Visit)        Chest Pain    Comes and goes.      History of Present Illness   Derek Love is a 53 year old male who presents with chest discomfort and an elevated coronary calcium  score. He is accompanied by his wife, Derek Love. He was referred by his primary care physician for evaluation of an elevated coronary calcium  score.  He presents with an elevated coronary calcium  score of 573, placing him in the 99th percentile. He experiences intermittent chest discomfort that can occur with activity, though it is not particularly bothersome. He describes a sensation of his heart 'beating weird' and has had episodes of lightheadedness. No chest pain, pressure, or tightness is reported. About a month ago, he experienced left arm discomfort and lower left chest discomfort, which was significant enough to almost prompt an ER visit. He also notes occasional swelling in his feet after consuming alcohol.  His family history is significant for heart disease, with his father experiencing heart troubles in his twenties and uncles having heart problems in their thirties. This familial pattern of early-onset heart disease is concerning given his current symptoms and elevated coronary calcium  score.  He underwent a heart monitor about seven to eight months ago, which showed no atrial fibrillation but did reveal premature ventricular and atrial beats. An episode where his watch detected atrial fibrillation was considered a false positive. Echo was normal recently.   He has a history of  hypertension, managed with olmesartan  40 mg daily, and hyperlipidemia, for which he is currently taking Crestor  40 mg daily. His most recent LDL was 159. He denies smoking and reports occasional alcohol consumption, about three to four drinks once a month. 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 He used to participate in CrossFit but has stopped recently due to concerns about his heart health.          Problem List CAD -CAC 573 (99th percentile) 2. HLD -T chol 233, HDL 58, LDL 159, TG 80 3. GERD 4. HTN     ROS: All other ROS reviewed and negative. Pertinent positives noted in the HPI.     Studies Reviewed: Derek Love   EKG Interpretation Date/Time:  Wednesday August 06 2023 10:04:39 EST Ventricular Rate:  68 PR Interval:  152 QRS Duration:  90 QT Interval:  376 QTC Calculation: 399 R Axis:   28  Text Interpretation: Normal sinus rhythm Normal ECG Confirmed by Derek Love [phone]) on 08/06/2023 10:05:38 AM    TTE 08/28/2022  1. Left ventricular ejection fraction, by estimation, is 60 to 65%. The  left ventricle has normal function. The left ventricle has no regional  wall motion abnormalities. Left ventricular diastolic parameters were  normal.   2. Right ventricular systolic function is normal. The right ventricular  size is normal. There is normal pulmonary artery systolic pressure.   3. The mitral valve is normal in structure. Trivial mitral valve  regurgitation. No evidence of mitral stenosis.  4. The aortic valve is tricuspid. There is mild calcification of the  aortic valve. Aortic valve regurgitation is trivial. Aortic valve  sclerosis/calcification is present, without any evidence of aortic  stenosis.   5. The inferior vena cava is normal in size with greater than 50%  respiratory variability, suggesting right atrial pressure of 3 mmHg.  Physical Exam:   VS:  BP 104/78 (BP Location: Right Arm, Patient Position:  Sitting, Cuff Size: Large)   Pulse 68   Ht 5' 11 (1.803 m)   Wt 218 lb (98.9 kg)   BMI 30.40 kg/m    Wt Readings from Last 3 Encounters:  08/06/23 218 lb (98.9 kg)  05/21/23 221 lb (100.2 kg)  04/02/23 221 lb (100.2 kg)    GEN: Well nourished, well developed in no acute distress NECK: No JVD; No carotid bruits CARDIAC: RRR, no murmurs, rubs, gallops RESPIRATORY:  Clear to auscultation without rales, wheezing or rhonchi  ABDOMEN: Soft, non-tender, non-distended EXTREMITIES:  No edema; No deformity  ASSESSMENT AND PLAN: .   Assessment and Plan    Elevated Coronary Calcium  Score Chest pain CAD with possible angina  Elevated coronary calcium  score of 573 (99th percentile) with intermittent chest discomfort. Strong family history of heart disease. No personal history of heart attack or stroke. Normal EKG and echo last year. LDL 159 on Crestor  40mg  daily. -Order coronary CTA to define anatomy. 50 mg metoprolol  tartrate prior to scan.  -Continue Aspirin  81mg  daily. -Check LPA, BMP, and repeat lipids. -Return for follow-up in 6 months. -Hold off on intense activity until results of CTA are available.   HLD -on crestor  40 mg daily -LDL not at goal -recheck lipids since on for 2 months.   Hypertension Well-controlled on Olmesartan  40mg  daily. Blood pressure at visit was 104/78. -Continue current management with Olmesartan  40mg  daily.              Follow-up: Return in about 6 months (around 02/03/2024).   Signed, Derek Love. Barbaraann, MD, [facility]  [facility]  [address], [address] [place], [place] [address] [phone]  11:06 AM

## 2023-08-06 ENCOUNTER — Encounter: Payer: Self-pay | Admitting: Cardiovascular Disease

## 2023-08-06 ENCOUNTER — Ambulatory Visit: Payer: Managed Care, Other (non HMO) | Attending: Cardiovascular Disease | Admitting: Cardiovascular Disease

## 2023-08-06 VITALS — BP 104/78 | HR 68 | Ht 71.0 in | Wt 218.0 lb

## 2023-08-06 DIAGNOSIS — I150 Renovascular hypertension: Secondary | ICD-10-CM

## 2023-08-06 DIAGNOSIS — R072 Precordial pain: Secondary | ICD-10-CM | POA: Diagnosis not present

## 2023-08-06 DIAGNOSIS — I2510 Atherosclerotic heart disease of native coronary artery without angina pectoris: Secondary | ICD-10-CM

## 2023-08-06 DIAGNOSIS — E782 Mixed hyperlipidemia: Secondary | ICD-10-CM

## 2023-08-06 DIAGNOSIS — R931 Abnormal findings on diagnostic imaging of heart and coronary circulation: Secondary | ICD-10-CM

## 2023-08-06 MED ORDER — METOPROLOL TARTRATE 50 MG PO TABS: 50.0000 mg | ORAL_TABLET | Freq: Once | ORAL | 0 refills | Status: DC

## 2023-08-06 NOTE — Patient Instructions (Signed)
 Medication Instructions:   Metoprolol   50 mg - one time use for test - see below   *If you need a refill on your cardiac medications before your next appointment, please call your pharmacy*   Lab Work: LPa LIPID BMP If you have labs (blood work) drawn today and your tests are completely normal, you will receive your results only by: MyChart Message (if you have MyChart) OR A paper copy in the mail If you have any lab test that is abnormal or we need to change your treatment, we will call you to review the results.   Testing/Procedures:  Will be schedule at Radiology dept  Your physician has requested that you have coronary  CTA. Coronary computed tomography (CT)angiogram  is a special type of CT scan that uses a computer to produce multi-dimensional views of major blood vessels throughout the heart.  CT angiography, a contrast material is injected through an IV to help visualize the blood vessels  a painless test that uses an x-ray machine to take clear, detailed pictures of your heart arteries .  Please follow instruction sheet as given.    Follow-Up: At [facility], you and your health needs are our priority.  As part of our continuing mission to provide you with exceptional heart care, we have created designated Provider Care Teams.  These Care Teams include your primary Cardiologist (physician) and Advanced Practice Providers (APPs -  Physician Assistants and Nurse Practitioners) who all work together to provide you with the care you need, when you need it.     Your next appointment:   6 month(s)  The format for your next appointment:   In Person  Provider:   Darryle ONEIDA Decent, MD    Other Instructions     Your cardiac CT will be scheduled at one of the below locations:   [facility] [address] [place], [place] [address] [phone]  OR  [facility] [address] [place], [place] [address] [phone]  OR   [facility] [address] [place], [place] [address] [phone]  OR   [facility] [address] [place], [place] [address] [phone]  If scheduled at [facility], please arrive at the [facility] and Children's Entrance (Entrance C2) of [facility] 30 minutes prior to test start time. You can use the FREE valet parking offered at entrance C (encouraged to control the heart rate for the test)  Proceed to the [facility] Radiology Department (first floor) to check-in and test prep.  All radiology patients and guests should use entrance C2 at [facility], accessed from [facility], even though the hospital's physical address listed is [address]    If scheduled at [facility] or [facility], please arrive 15 mins early for check-in and test prep.  There is spacious parking and easy access to the radiology department from the [facility] entrance. Please enter here and check-in with the desk attendant.   If scheduled at [facility], please arrive 30 minutes early for check-in and test prep.  Please follow these instructions carefully (unless otherwise directed):  An IV will be required for this test and Nitroglycerin  will be given.  Hold all erectile dysfunction medications at least 3 days (72 hrs) prior to test. (Ie viagra, cialis, sildenafil, tadalafil, etc)   On the Night Before the Test:  Be sure to Drink plenty of water. Do not consume any caffeinated/decaffeinated beverages or chocolate 12 hours prior to your test. Do not take any antihistamines 12 hours prior to your test.   On the Day of the Test: Drink plenty of water until 1 hour prior to the test. Do not eat any food 1 hour prior to test. You may take your regular medications prior to the test.  Take metoprolol   50 mg (Lopressor ) two hours prior  to test.        After the Test: Drink plenty of water. After receiving IV contrast, you may experience a mild flushed feeling. This is normal. On occasion, you may experience a mild rash up to 24 hours after the test. This is not dangerous. If this occurs, you can take Benadryl 25 mg, Zyrtec, Claritin, or Allegra and increase your fluid intake. (Patients taking Tikosyn should avoid Benadryl, and may take Zyrtec, Claritin, or Allegra) If you experience trouble breathing, this can be serious. If it is severe call 911 IMMEDIATELY. If it is mild, please call our office.  We will call to schedule your test 2-4 weeks out understanding that some insurance companies will need an authorization prior to the service being performed.   For more information and frequently asked questions, please visit our website : [email or web address]  For non-scheduling related questions, please contact the cardiac imaging nurse navigator should you have any questions/concerns: Cardiac Imaging Nurse Navigators Direct Office Dial: [phone]   For scheduling needs, including cancellations and rescheduling, please call Brittany, [phone].

## 2023-08-08 ENCOUNTER — Ambulatory Visit (INDEPENDENT_AMBULATORY_CARE_PROVIDER_SITE_OTHER): Payer: Self-pay

## 2023-08-08 DIAGNOSIS — J309 Allergic rhinitis, unspecified: Secondary | ICD-10-CM | POA: Diagnosis not present

## 2023-08-08 LAB — BASIC METABOLIC PANEL
BUN/Creatinine Ratio: 17 (ref 9–20)
BUN: 16 mg/dL (ref 6–24)
CO2: 26 mmol/L (ref 20–29)
Calcium: 9.1 mg/dL (ref 8.7–10.2)
Chloride: 101 mmol/L (ref 96–106)
Creatinine, Ser: 0.93 mg/dL (ref 0.76–1.27)
Glucose: 88 mg/dL (ref 70–99)
Potassium: 4.5 mmol/L (ref 3.5–5.2)
Sodium: 140 mmol/L (ref 134–144)
eGFR: 100 mL/min/{1.73_m2} (ref >59)

## 2023-08-08 LAB — LIPID PANEL
Chol/HDL Ratio: 2.4 {ratio} (ref 0.0–5.0)
Cholesterol, Total: 124 mg/dL (ref 100–199)
HDL: 52 mg/dL (ref >39)
LDL Chol Calc (NIH): 55 mg/dL (ref 0–99)
Triglycerides: 87 mg/dL (ref 0–149)
VLDL Cholesterol Cal: 17 mg/dL (ref 5–40)

## 2023-08-08 LAB — LIPOPROTEIN A (LPA): Lipoprotein (a): 37.8 nmol/L (ref <75.0)

## 2023-08-10 ENCOUNTER — Encounter: Payer: Self-pay | Admitting: Cardiovascular Disease

## 2023-08-12 ENCOUNTER — Other Ambulatory Visit: Payer: Self-pay | Admitting: Adult Health

## 2023-08-12 DIAGNOSIS — R1084 Generalized abdominal pain: Secondary | ICD-10-CM

## 2023-08-19 ENCOUNTER — Telehealth (HOSPITAL_COMMUNITY): Payer: Self-pay | Admitting: *Deleted

## 2023-08-19 NOTE — Telephone Encounter (Signed)
 Reaching out to patient to offer assistance regarding upcoming cardiac imaging study; pt verbalizes understanding of appt date/time, parking situation and where to check in, pre-test NPO status and medications ordered, and verified current allergies; name and call back number provided for further questions should they arise Johney Frame RN Navigator Cardiac Imaging [facility] [phone] office [phone] cell

## 2023-08-20 ENCOUNTER — Ambulatory Visit (HOSPITAL_COMMUNITY)
Admission: RE | Admit: 2023-08-20 | Discharge: 2023-08-20 | Disposition: A | Discharge status: Home / Self Care | Payer: Managed Care, Other (non HMO) | Source: Ambulatory Visit | Attending: Cardiovascular Disease | Admitting: Cardiovascular Disease

## 2023-08-20 ENCOUNTER — Other Ambulatory Visit: Payer: Self-pay | Admitting: Cardiology

## 2023-08-20 ENCOUNTER — Ambulatory Visit (HOSPITAL_BASED_OUTPATIENT_CLINIC_OR_DEPARTMENT_OTHER)
Admission: RE | Admit: 2023-08-20 | Discharge: 2023-08-20 | Disposition: A | Discharge status: Home / Self Care | Payer: Managed Care, Other (non HMO) | Source: Ambulatory Visit | Attending: Cardiology

## 2023-08-20 DIAGNOSIS — R931 Abnormal findings on diagnostic imaging of heart and coronary circulation: Secondary | ICD-10-CM | POA: Insufficient documentation

## 2023-08-20 DIAGNOSIS — E782 Mixed hyperlipidemia: Secondary | ICD-10-CM | POA: Insufficient documentation

## 2023-08-20 DIAGNOSIS — I2510 Atherosclerotic heart disease of native coronary artery without angina pectoris: Secondary | ICD-10-CM | POA: Diagnosis not present

## 2023-08-20 MED ORDER — NITROGLYCERIN 0.4 MG SL SUBL
0.8000 mg | SUBLINGUAL_TABLET | Freq: Once | SUBLINGUAL | Status: AC
  Administered 2023-08-20: 0.8 mg via SUBLINGUAL

## 2023-08-20 MED ORDER — SODIUM CHLORIDE 0.9 % IV BOLUS
500.0000 mL | Freq: Once | INTRAVENOUS | Status: AC
  Administered 2023-08-20: 500 mL via INTRAVENOUS

## 2023-08-20 MED ORDER — NITROGLYCERIN 0.4 MG SL SUBL
SUBLINGUAL_TABLET | SUBLINGUAL | Status: AC
  Filled 2023-08-20: qty 2

## 2023-08-20 MED ORDER — IOHEXOL 350 MG/ML SOLN
75.0000 mL | Freq: Once | INTRAVENOUS | Status: AC | PRN
  Administered 2023-08-20: 75 mL via INTRAVENOUS

## 2023-08-22 ENCOUNTER — Telehealth: Payer: Self-pay | Admitting: Cardiovascular Disease

## 2023-08-22 ENCOUNTER — Other Ambulatory Visit (HOSPITAL_COMMUNITY): Payer: Self-pay

## 2023-08-22 MED ORDER — NITROGLYCERIN 0.4 MG SL SUBL
0.4000 mg | SUBLINGUAL_TABLET | SUBLINGUAL | 3 refills | Status: DC | PRN
  Filled 2023-08-22: qty 25, 7d supply, fill #0
  Filled 2023-08-22: qty 75, 75d supply, fill #0

## 2023-08-22 MED ORDER — METOPROLOL TARTRATE 25 MG PO TABS
25.0000 mg | ORAL_TABLET | Freq: Two times a day (BID) | ORAL | 3 refills | Status: DC
Start: 2023-08-22 — End: 2023-09-11
  Filled 2023-08-22 (×2): qty 180, 90d supply, fill #0

## 2023-08-22 NOTE — Telephone Encounter (Signed)
 Called Mr. Gienger.  Discussed results from coronary CTA.  Concerns for moderate LAD disease and moderate distal RCA disease.  Study was nondiagnostic for the distal lesions.  He is on aspirin 81 mg daily.  We will start metoprolol to tartrate 25 mg twice daily and he was given a prescription for nitroglycerin.  Still having chest discomfort.  Comes and goes.  Not classically exertional.  No severe disease noted on CTA which is reassuring.  We discussed the need for cardiac cath.  We will have him come to the office week of March 3 to discuss this.  He is going out of town next week.  We will arrange an appointment and he will see him in follow-up.  Gerri Spore T. Flora Lipps, MD, [facility]  [facility]  [address], [address] [place], [place] [address] [phone]  2:23 PM

## 2023-09-01 ENCOUNTER — Ambulatory Visit (INDEPENDENT_AMBULATORY_CARE_PROVIDER_SITE_OTHER): Payer: Self-pay | Admitting: *Deleted

## 2023-09-01 DIAGNOSIS — J309 Allergic rhinitis, unspecified: Secondary | ICD-10-CM | POA: Diagnosis not present

## 2023-09-03 ENCOUNTER — Other Ambulatory Visit (HOSPITAL_COMMUNITY): Payer: Self-pay

## 2023-09-05 ENCOUNTER — Ambulatory Visit: Payer: Managed Care, Other (non HMO) | Admitting: Cardiovascular Disease

## 2023-09-09 NOTE — H&P (View-Only) (Signed)
 Cardiology Office Note:  .   Date:  09/11/2023  ID:  Derek Love, DOB 1970/08/24, MRN [id number] PCP: Shirline Frees, NP  [facility] Providers Cardiologist:  Reatha Harps, MD    History of Present Illness: .    Chief Complaint  Patient presents with   Follow-up    [name] is a 53 y.o. male with history of CAD, HTN, HLD who presents for follow-up. Poor quality CCTA. Needs cath.   History of Present Illness   The patient is a 53 year old with coronary artery disease who presents for follow-up and discussion of catheterization.  He recently underwent a coronary CTA, which was nondiagnostic for distal RCA disease. He is here to discuss the catheterization procedure.  He experiences chest discomfort when reducing his blood pressure medication, olmesartan, to half the dose. After three to four days on the reduced dose, he feels 'pressure and some weirdness,' but these symptoms resolve when he resumes the full dose. Currently, he is not experiencing any chest pressure. He did not tolerate metoprolol or nitroglycerin well, as they made him feel slowed down. He is currently taking aspirin 81 mg daily, Crestor 40 mg daily, and olmesartan at the full dose. He uses nitroglycerin only as needed for chest pain.  He reports symptoms that he attributes to acid reflux, noting discomfort that sometimes moves to the right side but is mostly on one side. These symptoms have been present for the last six months.  He has not been exercising recently and inquires about resuming physical activity. He has been traveling back and forth to [place] due to his father's hospice care for pancreatic cancer.          Problem List CAD -CAC 573 (99th percentile) -TPV 742 (93rd percentile)  2. HLD -T chol 124, HDL 52, LDL 55, TG 87 3. GERD 4. HTN     ROS: All other ROS reviewed and negative. Pertinent positives noted in the HPI.     Studies Reviewed: Marland Kitchen   EKG  Interpretation Date/Time:  Thursday September 11 2023 09:01:11 EDT Ventricular Rate:  58 PR Interval:  154 QRS Duration:  96 QT Interval:  412 QTC Calculation: 404 R Axis:   32  Text Interpretation: Sinus bradycardia When compared with ECG of 06-Aug-2023 10:04, No significant change was found Confirmed by Lennie Odor [phone]) on 09/11/2023 9:04:28 AM    TTE 08/28/2022   1. Left ventricular ejection fraction, by estimation, is 60 to 65%. The  left ventricle has normal function. The left ventricle has no regional  wall motion abnormalities. Left ventricular diastolic parameters were  normal.   2. Right ventricular systolic function is normal. The right ventricular  size is normal. There is normal pulmonary artery systolic pressure.   3. The mitral valve is normal in structure. Trivial mitral valve  regurgitation. No evidence of mitral stenosis.   4. The aortic valve is tricuspid. There is mild calcification of the  aortic valve. Aortic valve regurgitation is trivial. Aortic valve  sclerosis/calcification is present, without any evidence of aortic  stenosis.   5. The inferior vena cava is normal in size with greater than 50%  respiratory variability, suggesting right atrial pressure of 3 mmHg.   CCTA 08/20/2023 IMPRESSION: 1. Poor quality study. Poor contrast opacification, as only 75cc contrast was administered.   2. Coronary calcium score of 477. This was 98th percentile for age and sex matched control.   3. Total plaque volume 761mm3 which is 93rd percentile  for age and sex-matched controls (calcified plaque 175mm3; noncalcified plaque 562mm3). TPV is severe   4.  Normal coronary origin with right dominance.   5. Calcified plaque in distal RCA appears to cause at least moderate stenosis, but difficult to evaluate due to poor contrast opacification in distal RCA   6.  Moderate (50-69%) stenosis in proximal LAD   7.  Mild (25-49%) stenosis in mid LAD, D1, and proximal LCX    8.  Minimal (0-24%) stenosis in left main and proximal/mid RCA   9.  Will send study for CTFFR Physical Exam:   VS:  BP 110/68 (BP Location: Left Arm, Patient Position: Sitting, Cuff Size: Normal)   Pulse (!) 58   Ht 5\' 11"  (1.803 m)   Wt 221 lb (100.2 kg)   BMI 30.82 kg/m    Wt Readings from Last 3 Encounters:  09/11/23 221 lb (100.2 kg)  08/06/23 218 lb (98.9 kg)  05/21/23 221 lb (100.2 kg)    GEN: Well nourished, well developed in no acute distress NECK: No JVD; No carotid bruits CARDIAC: RRR, no murmurs, rubs, gallops RESPIRATORY:  Clear to auscultation without rales, wheezing or rhonchi  ABDOMEN: Soft, non-tender, non-distended EXTREMITIES:  No edema; No deformity  ASSESSMENT AND PLAN: .   Assessment and Plan    Coronary Artery Disease (CAD) Intermittent chest discomfort linked to olmesartan dose reduction. Asymptomatic on full dose. EKG normal. Echocardiogram normal LV function. Coronary CTA nondiagnostic for distal RCA and LAD disease. Informed consent for heart catheterization obtained. - Schedule heart catheterization on September 24, 2023. - Perform pre-procedure blood work today. - Continue aspirin 81 mg daily. - Continue Crestor 40 mg daily. - Continue full dose of olmesartan. - Allow exercise and activity as tolerated. - Use sublingual nitroglycerin as needed for chest pain.  Hypertension Self-adjustment of olmesartan led to chest discomfort. Full dose resolves symptoms. Blood pressure control crucial to prevent CAD exacerbation. - Continue full dose of olmesartan. - Monitor blood pressure regularly.  Hyperlipidemia Cholesterol controlled with Crestor. LDL at goal. High arterial calcium likely due to cholesterol, blood pressure, and family history. Dietary recommendations provided. - Continue Crestor 40 mg daily. - Maintain a diet rich in fresh fruits and vegetables, low in processed, packaged, and salty foods. - Prefer lean meats such as chicken and fish over  red meats like beef and pork.  Gastroesophageal Reflux Disease (GERD) Symptoms consistent with acid reflux reported. - Discuss with gastroenterologist during upcoming appointment on September 22, 2023.  Follow-up Heart catheterization scheduled. Follow-up with physician assistant and annual cardiologist visits planned. - Schedule follow-up appointment with physician assistant four weeks from today. - Schedule annual follow-up with cardiologist unless earlier follow-up is needed.          Informed Consent   Shared Decision Making/Informed Consent The risks [stroke (1 in 1000), death (1 in 1000), kidney failure [usually temporary] (1 in 500), bleeding (1 in 200), allergic reaction [possibly serious] (1 in 200)], benefits (diagnostic support and management of coronary artery disease) and alternatives of a cardiac catheterization were discussed in detail with Mr. Trice and he is willing to proceed.      Follow-up: Return in about 1 month (around 10/12/2023).   Signed, Lenna Gilford. Flora Lipps, MD, [facility]  [facility]  [address] [place], [place] [address] [phone]  9:38 AM

## 2023-09-09 NOTE — Progress Notes (Unsigned)
 Cardiology Office Note:  .   Date:  09/11/2023  ID:  Derek Love, DOB [birth year], MRN [id number] PCP: Shirline Frees, NP  [facility] Providers Cardiologist:  Reatha Harps, MD    History of Present Illness: .    Chief Complaint  Patient presents with   Follow-up    Derek Love is a 53 y.o. male with history of CAD, HTN, HLD who presents for follow-up. Poor quality CCTA. Needs cath.   History of Present Illness   The patient is a 53 year old with coronary artery disease who presents for follow-up and discussion of catheterization.  He recently underwent a coronary CTA, which was nondiagnostic for distal RCA disease. He is here to discuss the catheterization procedure.  He experiences chest discomfort when reducing his blood pressure medication, olmesartan, to half the dose. After three to four days on the reduced dose, he feels 'pressure and some weirdness,' but these symptoms resolve when he resumes the full dose. Currently, he is not experiencing any chest pressure. He did not tolerate metoprolol or nitroglycerin well, as they made him feel slowed down. He is currently taking aspirin 81 mg daily, Crestor 40 mg daily, and olmesartan at the full dose. He uses nitroglycerin only as needed for chest pain.  He reports symptoms that he attributes to acid reflux, noting discomfort that sometimes moves to the right side but is mostly on one side. These symptoms have been present for the last six months.  He has not been exercising recently and inquires about resuming physical activity. He has been traveling back and forth to [place] due to his father's hospice care for pancreatic cancer.          Problem List CAD -CAC 573 (99th percentile) -TPV 742 (93rd percentile)  2. HLD -T chol 124, HDL 52, LDL 55, TG 87 3. GERD 4. HTN     ROS: All other ROS reviewed and negative. Pertinent positives noted in the HPI.     Studies Reviewed: Marland Kitchen   EKG  Interpretation Date/Time:  Thursday September 11 2023 09:01:11 EDT Ventricular Rate:  58 PR Interval:  154 QRS Duration:  96 QT Interval:  412 QTC Calculation: 404 R Axis:   32  Text Interpretation: Sinus bradycardia When compared with ECG of 06-Aug-2023 10:04, No significant change was found Confirmed by Lennie Odor [phone]) on 09/11/2023 9:04:28 AM    TTE 08/28/2022   1. Left ventricular ejection fraction, by estimation, is 60 to 65%. The  left ventricle has normal function. The left ventricle has no regional  wall motion abnormalities. Left ventricular diastolic parameters were  normal.   2. Right ventricular systolic function is normal. The right ventricular  size is normal. There is normal pulmonary artery systolic pressure.   3. The mitral valve is normal in structure. Trivial mitral valve  regurgitation. No evidence of mitral stenosis.   4. The aortic valve is tricuspid. There is mild calcification of the  aortic valve. Aortic valve regurgitation is trivial. Aortic valve  sclerosis/calcification is present, without any evidence of aortic  stenosis.   5. The inferior vena cava is normal in size with greater than 50%  respiratory variability, suggesting right atrial pressure of 3 mmHg.   CCTA 08/20/2023 IMPRESSION: 1. Poor quality study. Poor contrast opacification, as only 75cc contrast was administered.   2. Coronary calcium score of 477. This was 98th percentile for age and sex matched control.   3. Total plaque volume 778mm3 which is 93rd percentile  for age and sex-matched controls (calcified plaque 133mm3; noncalcified plaque 571mm3). TPV is severe   4.  Normal coronary origin with right dominance.   5. Calcified plaque in distal RCA appears to cause at least moderate stenosis, but difficult to evaluate due to poor contrast opacification in distal RCA   6.  Moderate (50-69%) stenosis in proximal LAD   7.  Mild (25-49%) stenosis in mid LAD, D1, and proximal LCX    8.  Minimal (0-24%) stenosis in left main and proximal/mid RCA   9.  Will send study for CTFFR Physical Exam:   VS:  BP 110/68 (BP Location: Left Arm, Patient Position: Sitting, Cuff Size: Normal)   Pulse (!) 58   Ht 5\' 11"  (1.803 m)   Wt 221 lb (100.2 kg)   BMI 30.82 kg/m    Wt Readings from Last 3 Encounters:  09/11/23 221 lb (100.2 kg)  08/06/23 218 lb (98.9 kg)  05/21/23 221 lb (100.2 kg)    GEN: Well nourished, well developed in no acute distress NECK: No JVD; No carotid bruits CARDIAC: RRR, no murmurs, rubs, gallops RESPIRATORY:  Clear to auscultation without rales, wheezing or rhonchi  ABDOMEN: Soft, non-tender, non-distended EXTREMITIES:  No edema; No deformity  ASSESSMENT AND PLAN: .   Assessment and Plan    Coronary Artery Disease (CAD) Intermittent chest discomfort linked to olmesartan dose reduction. Asymptomatic on full dose. EKG normal. Echocardiogram normal LV function. Coronary CTA nondiagnostic for distal RCA and LAD disease. Informed consent for heart catheterization obtained. - Schedule heart catheterization on September 24, 2023. - Perform pre-procedure blood work today. - Continue aspirin 81 mg daily. - Continue Crestor 40 mg daily. - Continue full dose of olmesartan. - Allow exercise and activity as tolerated. - Use sublingual nitroglycerin as needed for chest pain.  Hypertension Self-adjustment of olmesartan led to chest discomfort. Full dose resolves symptoms. Blood pressure control crucial to prevent CAD exacerbation. - Continue full dose of olmesartan. - Monitor blood pressure regularly.  Hyperlipidemia Cholesterol controlled with Crestor. LDL at goal. High arterial calcium likely due to cholesterol, blood pressure, and family history. Dietary recommendations provided. - Continue Crestor 40 mg daily. - Maintain a diet rich in fresh fruits and vegetables, low in processed, packaged, and salty foods. - Prefer lean meats such as chicken and fish over  red meats like beef and pork.  Gastroesophageal Reflux Disease (GERD) Symptoms consistent with acid reflux reported. - Discuss with gastroenterologist during upcoming appointment on September 22, 2023.  Follow-up Heart catheterization scheduled. Follow-up with physician assistant and annual cardiologist visits planned. - Schedule follow-up appointment with physician assistant four weeks from today. - Schedule annual follow-up with cardiologist unless earlier follow-up is needed.          Informed Consent   Shared Decision Making/Informed Consent The risks [stroke (1 in 1000), death (1 in 1000), kidney failure [usually temporary] (1 in 500), bleeding (1 in 200), allergic reaction [possibly serious] (1 in 200)], benefits (diagnostic support and management of coronary artery disease) and alternatives of a cardiac catheterization were discussed in detail with Derek Love and he is willing to proceed.      Follow-up: Return in about 1 month (around 10/12/2023).   Signed, Lenna Gilford. Flora Lipps, MD, [facility]  [facility]  [address] [place], [place] [address] [phone]  9:38 AM

## 2023-09-11 ENCOUNTER — Encounter: Payer: Self-pay | Admitting: Cardiovascular Disease

## 2023-09-11 ENCOUNTER — Ambulatory Visit: Payer: Managed Care, Other (non HMO) | Attending: Cardiovascular Disease | Admitting: Cardiovascular Disease

## 2023-09-11 VITALS — BP 110/68 | HR 58 | Ht 71.0 in | Wt 221.0 lb

## 2023-09-11 DIAGNOSIS — I25118 Atherosclerotic heart disease of native coronary artery with other forms of angina pectoris: Secondary | ICD-10-CM

## 2023-09-11 DIAGNOSIS — E782 Mixed hyperlipidemia: Secondary | ICD-10-CM | POA: Diagnosis not present

## 2023-09-11 DIAGNOSIS — Z01812 Encounter for preprocedural laboratory examination: Secondary | ICD-10-CM | POA: Diagnosis not present

## 2023-09-11 DIAGNOSIS — I150 Renovascular hypertension: Secondary | ICD-10-CM

## 2023-09-11 LAB — CBC

## 2023-09-11 NOTE — Patient Instructions (Signed)
 Medication Instructions:  Your physician recommends that you continue on your current medications as directed. Please refer to the Current Medication list given to you today.    *If you need a refill on your cardiac medications before your next appointment, please call your pharmacy*   Lab Work: - Basic Metabolic Panel  - Complete Blood Count    If you have labs (blood work) drawn today and your tests are completely normal, you will receive your results only by: MyChart Message (if you have MyChart) OR A paper copy in the mail If you have any lab test that is abnormal or we need to change your treatment, we will call you to review the results.   Testing/Procedures:    Cardiac Catheterization   You are scheduled for a Cardiac Catheterization on Wednesday, March 26 with Dr. Peter [place].  1. Please arrive at the [facility] (Main Entrance A) at [facility]: [address] [place], [place] [address] at 6:30 AM (This time is 2 hour(s) before your procedure to ensure your preparation).   Free valet parking service is available. You will check in at ADMITTING. The support person will be asked to wait in the waiting room.  It is OK to have someone drop you off and come back when you are ready to be discharged.        Special note: Every effort is made to have your procedure done on time. Please understand that emergencies sometimes delay scheduled procedures.  2. Diet: Do not eat solid foods after midnight.  You may have clear liquids until 5 AM the day of the procedure.  3. Labs: You will need to have blood drawn on today  4. Medication instructions in preparation for your procedure:  On the morning of your procedure, take Aspirin 81 mg and any morning medicines NOT listed above.  You may use sips of water.  5. Plan to go home the same day, you will only stay overnight if medically necessary. 6. You MUST have a responsible adult to drive you home. 7. An adult MUST be with  you the first 24 hours after you arrive home. 8. Bring a current list of your medications, and the last time and date medication taken. 9. Bring ID and current insurance cards. 10.Please wear clothes that are easy to get on and off and wear slip-on shoes.  Thank you for allowing [place] to care for you!   -- [facility]    Follow-Up: At [facility], you and your health needs are our priority.  As part of our continuing mission to provide you with exceptional heart care, we have created designated Provider Care Teams.  These Care Teams include your primary Cardiologist (physician) and Advanced Practice Providers (APPs -  Physician Assistants and Nurse Practitioners) who all work together to provide you with the care you need, when you need it.  We recommend signing up for the patient portal called "MyChart".  Sign up information is provided on this After Visit Summary.  MyChart is used to connect with patients for Virtual Visits (Telemedicine).  Patients are able to view lab/test results, encounter notes, upcoming appointments, etc.  Non-urgent messages can be sent to your provider as well.   To learn more about what you can do with MyChart, go to [email or web address].    Your next appointment:   4 week(s)  The format for your next appointment:   In Person  Provider:   Edd Fabian, FNP,  Micah Flesher, PA-C, Marjie Skiff, PA-C, Robet Leu, PA-C, Juanda Crumble, PA-C, Joni Reining, DNP, ANP, Azalee Course, PA-C, Bernadene Person, NP, or Reather Littler, NP    Then, Reatha Harps, MD will plan to see you again in 1 year(s).   Other Instructions

## 2023-09-12 LAB — CBC
Hematocrit: 47 % (ref 37.5–51.0)
Hemoglobin: 15.5 g/dL (ref 13.0–17.7)
MCH: 30.1 pg (ref 26.6–33.0)
MCHC: 33 g/dL (ref 31.5–35.7)
MCV: 91 fL (ref 79–97)
Platelets: 167 10*3/uL (ref 150–450)
RBC: 5.15 x10E6/uL (ref 4.14–5.80)
RDW: 12.5 % (ref 11.6–15.4)
WBC: 5.1 10*3/uL (ref 3.4–10.8)

## 2023-09-12 LAB — BASIC METABOLIC PANEL
BUN/Creatinine Ratio: 16 (ref 9–20)
BUN: 16 mg/dL (ref 6–24)
CO2: 27 mmol/L (ref 20–29)
Calcium: 9.9 mg/dL (ref 8.7–10.2)
Chloride: 100 mmol/L (ref 96–106)
Creatinine, Ser: 1.01 mg/dL (ref 0.76–1.27)
Glucose: 86 mg/dL (ref 70–99)
Potassium: 4.9 mmol/L (ref 3.5–5.2)
Sodium: 142 mmol/L (ref 134–144)
eGFR: 90 mL/min/{1.73_m2} (ref >59)

## 2023-09-14 ENCOUNTER — Encounter: Payer: Self-pay | Admitting: Cardiovascular Disease

## 2023-09-17 ENCOUNTER — Other Ambulatory Visit: Payer: Self-pay | Admitting: Allergy

## 2023-09-18 ENCOUNTER — Other Ambulatory Visit: Payer: Self-pay

## 2023-09-18 MED ORDER — MONTELUKAST SODIUM 10 MG PO TABS: 10.0000 mg | ORAL_TABLET | Freq: Every day | ORAL | 5 refills | Status: DC

## 2023-09-21 NOTE — Progress Notes (Addendum)
 09/21/2023 Derek Love [id number] Dec 08, 1970   CHIEF COMPLAINT: Abdominal pain, discuss scheduling a colonoscopy   HISTORY OF PRESENT ILLNESS: Derek Love 53 year old male with a past medical history of hypertension, hyperlipidemia, coronary artery disease, asthma, hepatic steatosis and GERD. He presents to our office today as referred by Alto Atta NP to schedule a screening colonoscopy.  He is known by Dr. General Kenner. Father with history of colon polyps.  He typically passes a normal brown formed stool daily, stools are a bit firmer since starting Crestor  but denies straining.  No bloody or black stools.  No lower abdominal pain.  He denies ever having a screening colonoscopy.  He has a history of GERD for which he previously took Prevacid OTC which controlled his symptoms. However, 8 months ago he developed intermittent burning LUQ pain and less frequently had RUQ pain which sometimes worsened after eating and other times did not with increased acid reflux symptoms. His PCP prescribed Pantoprazole  40 mg daily which he continues to take and Carafate  3-4 times daily which he took for 1 to 2 weeks. At times, his LUQ pain radiating to the back.  No specific food triggers. Normal LFTs. He underwent RUQ sonogram 03/31/2023 which showed a normal gallbladder and possible mild hepatic steatosis.  It was noted that his father has terminal pancreatic cancer at the age of 28 and his PCP subsequently ordered a CTAP which was completed 04/08/2023 which showed a normal pancreas without identifying any acute intra-abdominal pathology to explain his symptoms.  His acid reflux symptoms abated but stated he has recurrent acid reflux if he goes 36 hours without taking a dose of Pantoprazole .  His LUQ burning discomfort usually occurred 4 days weekly which has improved, no LUQ or RUQ pain for the past week.  No nausea or vomiting.  He endorsed gaining 15 pounds over the past 3 months due to eating poorly,  somewhat due to stress related to his father's terminal pancreatic cancer. He has been traveling back and forth to [place]  where his father lives.  He underwent an EGD 05/13/2019 which showed diffuse gastritis, biopsies were negative for H. pylori.  He takes ASA 81 mg daily, infrequently takes other NSAID as needed.  He has a history of CAD, followed by cardiologist Dr. Jackquelyn Mass. He recently underwent a coronary CTA, which was nondiagnostic for distal RCA disease. He is scheduled for a left heart catheterization 09/24/2023. On ASA 81mg  every day and Crestor  40mg  every day. He denies having any recent chest pain, palpitations, dizziness or shortness of breath.      Latest Ref Rng & Units 09/11/2023    9:44 AM 03/28/2023    2:24 PM 02/04/2022    7:43 AM  CBC  WBC 3.4 - 10.8 x10E3/uL 5.1  7.2  6.4   Hemoglobin 13.0 - 17.7 g/dL 84.6  96.2  95.2   Hematocrit 37.5 - 51.0 % 47.0  46.4  45.3   Platelets 150 - 450 x10E3/uL 167  203.0  230.0        Latest Ref Rng & Units 09/11/2023    9:44 AM 08/07/2023    9:42 AM 04/08/2023    5:59 AM  CMP  Glucose 70 - 99 mg/dL 86  88    BUN 6 - 24 mg/dL 16  16    Creatinine 8.41 - 1.27 mg/dL 3.24  4.01  0.27   Sodium 134 - 144 mmol/L 142  140    Potassium 3.5 - 5.2  mmol/L 4.9  4.5    Chloride 96 - 106 mmol/L 100  101    CO2 20 - 29 mmol/L 27  26    Calcium  8.7 - 10.2 mg/dL 9.9  9.1         Latest Ref Rng & Units 03/28/2023    2:24 PM 05/06/2022    7:40 AM 02/04/2022    7:43 AM  Hepatic Function  Total Protein 6.0 - 8.3 g/dL 6.9  6.7  6.8   Albumin 3.5 - 5.2 g/dL 4.6  4.6  4.6   AST 0 - 37 U/L 18  20  20    ALT 0 - 53 U/L 26  31  26    Alk Phosphatase 39 - 117 U/L 79  73  88   Total Bilirubin 0.2 - 1.2 mg/dL 0.5  0.7  0.7      CARDIAC EVALUATION: TTE 08/28/2022   1. Left ventricular ejection fraction, by estimation, is 60 to 65%. The  left ventricle has normal function. The left ventricle has no regional  wall motion abnormalities. Left ventricular  diastolic parameters were  normal.   2. Right ventricular systolic function is normal. The right ventricular  size is normal. There is normal pulmonary artery systolic pressure.   3. The mitral valve is normal in structure. Trivial mitral valve  regurgitation. No evidence of mitral stenosis.   4. The aortic valve is tricuspid. There is mild calcification of the  aortic valve. Aortic valve regurgitation is trivial. Aortic valve  sclerosis/calcification is present, without any evidence of aortic  stenosis.   5. The inferior vena cava is normal in size with greater than 50%  respiratory variability, suggesting right atrial pressure of 3 mmHg.    CCTA 08/20/2023 IMPRESSION: 1. Poor quality study. Poor contrast opacification, as only 75cc contrast was administered.   2. Coronary calcium  score of 477. This was 98th percentile for age and sex matched control.   3. Total plaque volume 711mm3 which is 93rd percentile for age and sex-matched controls (calcified plaque 172mm3; noncalcified plaque 551mm3). TPV is severe   4.  Normal coronary origin with right dominance.   5. Calcified plaque in distal RCA appears to cause at least moderate stenosis, but difficult to evaluate due to poor contrast opacification in distal RCA   6.  Moderate (50-69%) stenosis in proximal LAD   7.  Mild (25-49%) stenosis in mid LAD, D1, and proximal LCX   8.  Minimal (0-24%) stenosis in left main and proximal/mid RCA   PAST GI PROCEDURES   EGD 05/13/2019 by Dr. General Kenner: - Esophagogastric landmarks identified.  - Gastroesophageal flap valve classified as Hill Grade I (prominent fold, tight to endoscope).  - Normal esophagus otherwise, no evidence of Barrett's or esophagitis.  - Diffuse gastritis as above. Biopsied.  - Normal duodenal bulb and second portion of the duodenum.  Surgical [P], gastric - GASTRIC ANTRAL AND OXYNTIC MUCOSA WITH MILDLY ACTIVE CHRONIC NONSPECIFIC GASTRITIS AND  REACTIVE CHANGES - WARTHIN-STARRY STAIN IS NEGATIVE FOR HELICOBACTER PYLORI   Past Medical History:  Diagnosis Date   ADHD (attention deficit hyperactivity disorder)    Allergic rhinitis    Asthma    Coronary artery disease    Eczema    GERD (gastroesophageal reflux disease)    Headache(784.0)    Hypertension    Positive PPD, treated    Past Surgical History:  Procedure Laterality Date   No Surgical History     UPPER GI ENDOSCOPY  2001  Social History:  reports that he has never smoked. He has never used smokeless tobacco. He reports current alcohol use. He reports that he does not use drugs.  Family History: family history includes Allergic rhinitis in his maternal grandmother; Colon polyps in his father; Coronary artery disease in an other family member; Diabetes in an other family member; Heart disease in his father and paternal grandfather; Hyperlipidemia in an other family member; Hypertension in an other family member; Hypertrophic cardiomyopathy in his sister; Lung cancer in an other family member; Stroke in his father and another family member.  No Known Allergies    Outpatient Encounter Medications as of 09/22/2023  Medication Sig   aspirin  EC 81 MG tablet Take 81 mg by mouth daily.   Carbinoxamine  Maleate (RYVENT ) 6 MG TABS Take 1 tablet (6 mg total) by mouth in the morning and at bedtime. (Patient taking differently: Take 6 mg by mouth daily.)   clobetasol  ointment (TEMOVATE ) 0.05 % Apply 1 Application topically 2 (two) times daily as needed (itchy rash). (Patient not taking: Reported on 09/18/2023)   montelukast  (SINGULAIR ) 10 MG tablet Take 1 tablet (10 mg total) by mouth at bedtime.   nitroGLYCERIN  (NITROSTAT ) 0.4 MG SL tablet Place 1 tablet (0.4 mg total) under the tongue every 5 (five) minutes as needed for chest pain.   olmesartan  (BENICAR ) 40 MG tablet Take 1 tablet (40 mg total) by mouth daily.   Olopatadine -Mometasone (RYALTRIS ) 665-25 MCG/ACT SUSP Place 2  sprays into the nose 2 (two) times daily.   pantoprazole  (PROTONIX ) 40 MG tablet TAKE 1 TABLET BY MOUTH DAILY   rosuvastatin  (CRESTOR ) 40 MG tablet Take 1 tablet (40 mg total) by mouth daily.   No facility-administered encounter medications on file as of 09/22/2023.    REVIEW OF SYSTEMS:  Gen: Denies fever, sweats or chills. No weight loss.  CV: Denies chest pain, palpitations or edema. Resp: Denies cough, shortness of breath of hemoptysis.  GI: See HPI. GU: Denies urinary burning, blood in urine, increased urinary frequency or incontinence. MS: Denies joint pain, muscles aches or weakness. Derm: Denies rash, itchiness, skin lesions or unhealing ulcers. Psych: Denies depression, anxiety, memory loss or confusion. Heme: Denies bruising, easy bleeding. Neuro:  Denies headaches, dizziness or paresthesias. Endo:  Denies any problems with DM, thyroid  or adrenal function.  PHYSICAL EXAM: BP 122/80   Pulse 63   Ht 6' (1.829 m)   Wt 227 lb (103 kg)   BMI 30.79 kg/m   General: 53 year old male in no acute distress. Head: Normocephalic and atraumatic. Eyes:  Sclerae non-icteric, conjunctive pink. Ears: Normal auditory acuity. Mouth: Dentition intact. No ulcers or lesions.  Neck: Supple, no lymphadenopathy or thyromegaly.  Lungs: Clear bilaterally to auscultation without wheezes, crackles or rhonchi. Heart: Regular rate and rhythm. No murmur, rub or gallop appreciated.  Abdomen: Soft, nontender, nondistended. No masses. No hepatosplenomegaly. Normoactive bowel sounds x 4 quadrants.  Rectal: Deferred.  Musculoskeletal: Symmetrical with no gross deformities. Skin: Warm and dry. No rash or lesions on visible extremities. Extremities: No edema. Neurological: Alert oriented x 4, no focal deficits.  Psychological:  Alert and cooperative. Normal mood and affect.  ASSESSMENT AND PLAN:  53 year old male presents to schedule a screening colonoscopy.  Father with history of colon  polyps. -Colonoscopy benefits and risks discussed including risk with sedation, risk of bleeding, perforation and infection  -Colonoscopy to be scheduled after cardiac clearance received as patient is scheduled for a cardiac catheterization 09/24/2023 -Benefiber 1 tablespoon daily as needed  if needed to avoid straining  GERD with intermittent LUQ and RUQ pain x 8 months, no upper abdominal pain x 1 week. RUQ sono showed a normal gallbladder. CTAP without intra-abdominal pathology to explain his pain. -Continue Pantoprazole  40 mg daily -May take Pepcid  20 mg 1 tab daily as needed OTC -GERD diet handout -EGD benefits and risks discussed including risk with sedation, risk of bleeding, perforation and infection  -EGD to be scheduled after cardiac clearance received as patient is scheduled for a cardiac catheterization 09/24/2023 -Diatherix H. pylori stool antigen -Weight loss recommended  CAD, coronary CT 08/20/2023 results were nondiagnostic for distal RCA disease. Patient is scheduled for a left heart catheterization 09/24/2023 -Request cardiac clearance from Dr. Jackquelyn Mass prior to scheduling EGD and colonoscopy  Mild hepatic steatosis per RUQ sonogram 03/2023 -Recommend annual LFTs with PCP -Defer exercise recommendations to his cardiologist -Avoid weight gain   ADDENDUM: CARDIAC CLEARANCE RECEIVED Primary Cardiologist: Oneil Bigness, MD   Chart reviewed as part of pre-operative protocol coverage. Given past medical history and time since last visit, based on ACC/AHA guidelines, Derek Love is at acceptable risk for the planned procedure without further cardiovascular testing.    Patient was last seen in the office on 10/06/2023 by Dr. Rolm Clos and was stable from a cardiac standpoint.  Per Dr. Rolm Clos, "OK for colonoscopy."     I will route this recommendation to the requesting party via Epic fax function and remove from pre-op pool.   Please call with questions.   Jude Norton,  NP 10/20/2023, 9:19 AM       CC:  Alto Atta, NP

## 2023-09-22 ENCOUNTER — Encounter: Payer: Self-pay | Admitting: Nurse Practitioner

## 2023-09-22 ENCOUNTER — Ambulatory Visit: Payer: Managed Care, Other (non HMO) | Admitting: Nurse Practitioner

## 2023-09-22 VITALS — BP 122/80 | HR 63 | Ht 72.0 in | Wt 227.0 lb

## 2023-09-22 DIAGNOSIS — I2510 Atherosclerotic heart disease of native coronary artery without angina pectoris: Secondary | ICD-10-CM

## 2023-09-22 DIAGNOSIS — R1012 Left upper quadrant pain: Secondary | ICD-10-CM | POA: Diagnosis not present

## 2023-09-22 DIAGNOSIS — K219 Gastro-esophageal reflux disease without esophagitis: Secondary | ICD-10-CM

## 2023-09-22 DIAGNOSIS — Z83719 Family history of colon polyps, unspecified: Secondary | ICD-10-CM

## 2023-09-22 DIAGNOSIS — K760 Fatty (change of) liver, not elsewhere classified: Secondary | ICD-10-CM

## 2023-09-22 DIAGNOSIS — Z1211 Encounter for screening for malignant neoplasm of colon: Secondary | ICD-10-CM

## 2023-09-22 DIAGNOSIS — R1011 Right upper quadrant pain: Secondary | ICD-10-CM

## 2023-09-22 NOTE — Patient Instructions (Addendum)
 Cardiac clearance is required prior to scheduling your endoscopy and colonoscopy.  Benefiber- take 1 tablespoon daily as needed (over the counter)  Pepcid 20 mg- take 1 by mouth daily as needed (over the counter)  Your provider has ordered "Diatherix" stool testing for you. You have received a kit from our office today containing all necessary supplies to complete this test. Please carefully read the stool collection instructions provided in the kit before opening the accompanying materials. In addition, be sure there is a label providing your full name and date of birth on the "puritan opti-swab" tube that is supplied in the kit (if you do not see a label with this information on your test tube, please make [place] aware before test collection!). After completing the test, you should secure the purtian tube into the specimen biohazard bag. The [facility] E-Req sheet (including date and time of specimen collection) should be placed into the outside pocket of the specimen biohazard bag and returned to the [facility] (basement floor of [facility]) within 3 days of collection. Please make sure to give the specimen to a staff member at the lab. DO NOT leave the specimen on the counter.   If the specimen date and time (can be found in the upper right boxed portion of the sheet) are not filled out on the E-Req sheet, the test will NOT be performed.   Due to recent changes in healthcare laws, you may see the results of your imaging and laboratory studies on MyChart before your provider has had a chance to review them.  We understand that in some cases there may be results that are confusing or concerning to you. Not all laboratory results come back in the same time frame and the provider may be waiting for multiple results in order to interpret others.  Please give [place] 48 hours in order for your provider to thoroughly review all the results before contacting the office for  clarification of your results.   Thank you for trusting me with your gastrointestinal care!   Alcide Evener, CRNP

## 2023-09-22 NOTE — Progress Notes (Signed)
 Agree with assessment and plan as outlined.

## 2023-09-23 ENCOUNTER — Telehealth: Payer: Self-pay | Admitting: *Deleted

## 2023-09-23 NOTE — Telephone Encounter (Signed)
 Cardiac Catheterization scheduled at [facility] for: Wednesday September 24, 2023 8:30 AM Arrival time [facility] Main Entrance A at: 6:30 AM  Nothing to eat after midnight prior to procedure, clear liquids until 5 AM day of procedure  Medication instructions: -Usual morning medications can be taken with sips of water including aspirin 81 mg.  Plan to go home the same day, you will only stay overnight if medically necessary.  You must have responsible adult to drive you home.  Someone must be with you the first 24 hours after you arrive home.  Reviewed procedure instructions with patient.

## 2023-09-24 ENCOUNTER — Encounter (HOSPITAL_COMMUNITY): Payer: Self-pay | Admitting: Cardiology

## 2023-09-24 ENCOUNTER — Encounter (HOSPITAL_COMMUNITY)
Admission: RE | Disposition: A | Discharge status: Home / Self Care | Payer: Self-pay | Source: Home / Self Care | Attending: Cardiology

## 2023-09-24 ENCOUNTER — Ambulatory Visit (HOSPITAL_COMMUNITY)
Admission: RE | Admit: 2023-09-24 | Discharge: 2023-09-24 | Disposition: A | Discharge status: Home / Self Care | Attending: Cardiology | Admitting: Cardiology

## 2023-09-24 DIAGNOSIS — E785 Hyperlipidemia, unspecified: Secondary | ICD-10-CM | POA: Diagnosis not present

## 2023-09-24 DIAGNOSIS — I10 Essential (primary) hypertension: Secondary | ICD-10-CM | POA: Insufficient documentation

## 2023-09-24 DIAGNOSIS — Z79899 Other long term (current) drug therapy: Secondary | ICD-10-CM | POA: Insufficient documentation

## 2023-09-24 DIAGNOSIS — K219 Gastro-esophageal reflux disease without esophagitis: Secondary | ICD-10-CM | POA: Diagnosis not present

## 2023-09-24 DIAGNOSIS — I25118 Atherosclerotic heart disease of native coronary artery with other forms of angina pectoris: Secondary | ICD-10-CM | POA: Insufficient documentation

## 2023-09-24 DIAGNOSIS — Z7982 Long term (current) use of aspirin: Secondary | ICD-10-CM | POA: Diagnosis not present

## 2023-09-24 HISTORY — PX: LEFT HEART CATH AND CORONARY ANGIOGRAPHY: CATH118249

## 2023-09-24 SURGERY — LEFT HEART CATH AND CORONARY ANGIOGRAPHY
Anesthesia: LOCAL

## 2023-09-24 MED ORDER — SODIUM CHLORIDE 0.9 % WEIGHT BASED INFUSION: 1.0000 mL/kg/h | INTRAVENOUS | Status: DC

## 2023-09-24 MED ORDER — LIDOCAINE HCL (PF) 1 % IJ SOLN
INTRAMUSCULAR | Status: DC | PRN
  Administered 2023-09-24: 2 mL

## 2023-09-24 MED ORDER — FENTANYL CITRATE (PF) 100 MCG/2ML IJ SOLN
INTRAMUSCULAR | Status: DC | PRN
  Administered 2023-09-24: 25 ug via INTRAVENOUS

## 2023-09-24 MED ORDER — FENTANYL CITRATE (PF) 100 MCG/2ML IJ SOLN
INTRAMUSCULAR | Status: AC
  Filled 2023-09-24: qty 2

## 2023-09-24 MED ORDER — HEPARIN (PORCINE) IN NACL 1000-0.9 UT/500ML-% IV SOLN
INTRAVENOUS | Status: DC | PRN
  Administered 2023-09-24 (×2): 500 mL

## 2023-09-24 MED ORDER — LIDOCAINE HCL (PF) 1 % IJ SOLN
INTRAMUSCULAR | Status: AC
  Filled 2023-09-24: qty 30

## 2023-09-24 MED ORDER — MIDAZOLAM HCL 2 MG/2ML IJ SOLN
INTRAMUSCULAR | Status: AC
  Filled 2023-09-24: qty 2

## 2023-09-24 MED ORDER — MIDAZOLAM HCL 2 MG/2ML IJ SOLN
INTRAMUSCULAR | Status: DC | PRN
  Administered 2023-09-24: 2 mg via INTRAVENOUS

## 2023-09-24 MED ORDER — ASPIRIN 81 MG PO CHEW: 81.0000 mg | CHEWABLE_TABLET | ORAL | Status: DC

## 2023-09-24 MED ORDER — HEPARIN SODIUM (PORCINE) 1000 UNIT/ML IJ SOLN
INTRAMUSCULAR | Status: AC
  Filled 2023-09-24: qty 10

## 2023-09-24 MED ORDER — VERAPAMIL HCL 2.5 MG/ML IV SOLN
INTRAVENOUS | Status: DC | PRN
  Administered 2023-09-24: 10 mL via INTRA_ARTERIAL

## 2023-09-24 MED ORDER — VERAPAMIL HCL 2.5 MG/ML IV SOLN
INTRAVENOUS | Status: AC
  Filled 2023-09-24: qty 2

## 2023-09-24 MED ORDER — IOHEXOL 350 MG/ML SOLN
INTRAVENOUS | Status: DC | PRN
  Administered 2023-09-24: 50 mL

## 2023-09-24 MED ORDER — HEPARIN SODIUM (PORCINE) 1000 UNIT/ML IJ SOLN
INTRAMUSCULAR | Status: DC | PRN
  Administered 2023-09-24: 5000 [IU] via INTRAVENOUS

## 2023-09-24 MED ORDER — SODIUM CHLORIDE 0.9 % WEIGHT BASED INFUSION: 3.0000 mL/kg/h | INTRAVENOUS | Status: AC

## 2023-09-24 SURGICAL SUPPLY — 10 items
CATH INFINITI 5 FR JL3.5 (CATHETERS) IMPLANT
CATH INFINITI JR4 5F (CATHETERS) IMPLANT
DEVICE RAD COMP TR BAND LRG (VASCULAR PRODUCTS) IMPLANT
GLIDESHEATH SLEND SS 6F .021 (SHEATH) IMPLANT
GUIDEWIRE INQWIRE 1.5J.035X260 (WIRE) IMPLANT
INQWIRE 1.5J .035X260CM (WIRE) ×1 IMPLANT
KIT SYRINGE INJ CVI SPIKEX1 (MISCELLANEOUS) IMPLANT
PACK CARDIAC CATHETERIZATION (CUSTOM PROCEDURE TRAY) ×1 IMPLANT
SET ATX-X65L (MISCELLANEOUS) IMPLANT
SHEATH PROBE COVER 6X72 (BAG) IMPLANT

## 2023-09-24 NOTE — Interval H&P Note (Signed)
 History and Physical Interval Note:  09/24/2023 7:00 AM  Derek Love  has presented today for surgery, with the diagnosis of unstable angina.  The various methods of treatment have been discussed with the patient and family. After consideration of risks, benefits and other options for treatment, the patient has consented to  Procedure(s): LEFT HEART CATH AND CORONARY ANGIOGRAPHY (N/A) as a surgical intervention.  The patient's history has been reviewed, patient examined, no change in status, stable for surgery.  I have reviewed the patient's chart and labs.  Questions were answered to the patient's satisfaction.    Cath Lab Visit (complete for each Cath Lab visit)  Clinical Evaluation Leading to the Procedure:   ACS: No.  Non-ACS:    Anginal Classification: CCS II  Anti-ischemic medical therapy: No Therapy  Non-Invasive Test Results: Low-risk stress test findings: cardiac mortality <1%/year  Prior CABG: No previous CABG       Theron [facility] 09/24/2023 7:00 AM

## 2023-09-30 ENCOUNTER — Telehealth: Payer: Self-pay | Admitting: Nurse Practitioner

## 2023-09-30 ENCOUNTER — Ambulatory Visit (INDEPENDENT_AMBULATORY_CARE_PROVIDER_SITE_OTHER): Payer: Self-pay | Admitting: *Deleted

## 2023-09-30 ENCOUNTER — Telehealth: Payer: Self-pay

## 2023-09-30 ENCOUNTER — Encounter: Payer: Self-pay | Admitting: Cardiovascular Disease

## 2023-09-30 DIAGNOSIS — J309 Allergic rhinitis, unspecified: Secondary | ICD-10-CM | POA: Diagnosis not present

## 2023-09-30 NOTE — Telephone Encounter (Signed)
 [facility] [address] [place], [place]  [address] Phone:  [phone]   Fax:  [phone]   Derek Love DOB: May 04, 1971 MRN: [id number]  Dear: Dr. Flora Lipps :   The patient above is schedule for a endosocpy & colonoscopy in the near future under general anesthesia (Propofol).    Please fax or route a note of Medical Clearance to [phone], Attn: Derek Love, CMA  Please advise if this patient will require an office visit or further medical work-up before clearance can be given.   Thank you,    [facility]

## 2023-09-30 NOTE — Telephone Encounter (Signed)
   Name: Derek Love  DOB: 1971/01/25  MRN: [id number]  Primary Cardiologist: Reatha Harps, MD  Chart reviewed as part of pre-operative protocol coverage. Because of [name] past medical history and time since last visit, he will require a follow-up in-office visit in order to better assess preoperative cardiovascular risk.  Pre-op covering staff: - Please schedule appointment and call patient to inform them. If patient already had an upcoming appointment within acceptable timeframe, please add "pre-op clearance" to the appointment notes so provider is aware. - Please contact requesting surgeon's office via preferred method (i.e, phone, fax) to inform them of need for appointment prior to surgery.  No medication indicated as needing held.   Sharlene Dory, PA-C  09/30/2023, 4:34 PM

## 2023-09-30 NOTE — Telephone Encounter (Signed)
 Clearance was sent to Dr.O'Neal.

## 2023-09-30 NOTE — Telephone Encounter (Signed)
 Able to use all fingers but feels strained on his hand. No new swelling but bruises in the last 3 days. Rates pain 1/10 with using it. Some pressure at rest. Denies numbness or tingling.  Instructed patient that I will forward his message to Dr Flora Lipps.

## 2023-09-30 NOTE — Telephone Encounter (Signed)
 I will update the requesting office to please just fax clearance to our preop fax line [phone] attn: pre op, as we have a format that the preop team follows.       Pre-operative Risk Assessment    Patient Name: Derek Love  DOB: 09-12-1970 MRN: [id number]   Date of last office visit: 09/11/23 DR. O'NEAL Date of next office visit: 10/06/23 DR. O'NEAL   Request for Surgical Clearance    Procedure: COLONOSCOPY/ENDOSCOPY    Date of Surgery:  Clearance TBD                                Surgeon:  NOT LISTED Surgeon's Group or Practice Name:  [facility] Phone number:  [phone] Fax number:  [phone]   Type of Clearance Requested:   - Medical ; NONE INDICATED PER REQUEST TO HOLD    Type of Anesthesia:   PROPOFOL   Additional requests/questions:    Elpidio Anis   09/30/2023, 4:24 PM

## 2023-09-30 NOTE — Telephone Encounter (Signed)
 DD, refer to office visit 09/22/2023. A cardiac clearance was ordered prior to the patient scheduling an EGD and colonoscopy. Patient underwent a cardiac cath 09/24/2023. Please send official request for cardiac clearance to cardiology Dr. Lennie Odor.

## 2023-10-01 NOTE — Telephone Encounter (Signed)
 [facility] Pre-operative Risk Assessment     Request for surgical clearance:     Endoscopy Procedure  What type of surgery is being performed?     Endoscopy & colonoscopy  When is this surgery scheduled?     TBD  What type of clearance is required ?   Medical  Are there any medications that need to be held prior to surgery and how long? N/A  Practice name and name of physician performing surgery?      [facility], Dr.Armbruster  What is your office phone and fax number?      Phone- [phone]  Fax- [phone]  Anesthesia type (None, local, MAC, general) ?       MAC   Please route your response to Devian Bartolomei, CMA

## 2023-10-05 NOTE — Progress Notes (Unsigned)
 Cardiology Office Note:  .   Date:  10/06/2023  ID:  Derek Love, DOB [birth year], MRN [id number] PCP: Derek Frees, NP  [facility] Providers Cardiologist:  Derek Harps, MD {  History of Present Illness: .    Chief Complaint  Patient presents with   Follow-up    Derek Love is a 53 y.o. male with history of CAD who presents for follow-up.    History of Present Illness   Derek Love is a 53 year old male with coronary artery disease who presents for follow-up after a recent left heart catheterization.  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 The lesion is small and deemed manageable with medication. His arm is feeling much better following the procedure.  He experiences a burning sensation in the lower chest area, which he attributes to acid reflux rather than angina. This sensation has improved since he began taking his acid reflux medication every morning, as opposed to sporadically. No recent chest discomfort related to exertion.  He is currently on aspirin 81 mg daily, Crestor 40 mg daily for cholesterol management, and olmesartan for hypertension. He has gained 20 pounds recently and plans to address this through dietary changes and increased physical activity.  His family history is significant for his father's recent diagnosis of advanced pancreatic cancer. He is concerned about the potential hereditary nature of cancer. His father had a history of smoking, alcohol use, and underwent bypass surgery at the age of 29, which he acknowledges as a potential hereditary risk for coronary artery disease.          Problem List CAD -CAC 573 (99th percentile) -TPV 742 (93rd percentile)  -LHC 09/24/2023: 20% LAD; 30% Om1; 80% rPDA 2. HLD -T chol 124, HDL 52, LDL 55, TG 87 3. GERD 4. HTN     ROS: All other ROS reviewed and negative. Pertinent  positives noted in the HPI.     Studies Reviewed: Marland Kitchen   EKG Interpretation Date/Time:  Monday October 06 2023 13:52:17 EDT Ventricular Rate:  61 PR Interval:  164 QRS Duration:  96 QT Interval:  416 QTC Calculation: 418 R Axis:   27  Text Interpretation: Normal sinus rhythm Normal ECG Confirmed by Derek Love [phone]) on 10/06/2023 2:11:14 PM   LHC 09/24/2023 Single vessel obstructive CAD involving mid- distal PDA Normal LV function Normal LVEDP Physical Exam:   VS:  BP 110/72   Pulse 61   Ht 6' (1.829 m)   Wt 227 lb (103 kg)   SpO2 94%   BMI 30.79 kg/m    Wt Readings from Last 3 Encounters:  10/06/23 227 lb (103 kg)  09/24/23 225 lb (102.1 kg)  09/22/23 227 lb (103 kg)    GEN: Well nourished, well developed in no acute distress NECK: No JVD; No carotid bruits CARDIAC: RRR, no murmurs, rubs, gallops RESPIRATORY:  Clear to auscultation without rales, wheezing or rhonchi  ABDOMEN: Soft, non-tender, non-distended EXTREMITIES:  No edema; No deformity  ASSESSMENT AND PLAN: .   Assessment and Plan    Coronary Artery Disease (CAD) with 80% RPDA lesion 80% RPDA lesion confirmed by catheterization, not suitable for stenting, low risk of cardiac events. Burning sensation likely GERD-related, not angina. - Continue aspirin 81 mg daily. - Continue Crestor 40 mg daily. - Encourage regular exercise and a healthy diet. - Monitor for new symptoms such as chest pressure or tightness  during activity. - Reassess in 6 months.  Hypertension Blood pressure well-controlled with current medication. - Continue olmesartan.  Hyperlipidemia Cholesterol levels well-controlled with Crestor, LDL at goal of 55. - Continue Crestor 40 mg daily. - Encourage diet low in saturated fats, high in fruits, vegetables, chicken, and fish.  Gastroesophageal Reflux Disease (GERD) Burning sensation likely GERD-related, not angina. - Continue current acid reflux medication regimen.               Follow-up: Return in about 6 months (around 04/06/2024).   Signed, Derek Love. Flora Lipps, MD, [facility]  [facility]  [address] [place], [place] [address] [phone]  2:28 PM

## 2023-10-06 ENCOUNTER — Encounter: Payer: Self-pay | Admitting: Cardiovascular Disease

## 2023-10-06 ENCOUNTER — Ambulatory Visit: Attending: Cardiovascular Disease | Admitting: Cardiovascular Disease

## 2023-10-06 VITALS — BP 110/72 | HR 61 | Ht 72.0 in | Wt 227.0 lb

## 2023-10-06 DIAGNOSIS — E782 Mixed hyperlipidemia: Secondary | ICD-10-CM | POA: Diagnosis not present

## 2023-10-06 DIAGNOSIS — I150 Renovascular hypertension: Secondary | ICD-10-CM

## 2023-10-06 DIAGNOSIS — I25118 Atherosclerotic heart disease of native coronary artery with other forms of angina pectoris: Secondary | ICD-10-CM

## 2023-10-06 NOTE — Patient Instructions (Signed)
 Medication Instructions:  Your physician recommends that you continue on your current medications as directed. Please refer to the Current Medication list given to you today.    *If you need a refill on your cardiac medications before your next appointment, please call your pharmacy*   Lab Work: None    If you have labs (blood work) drawn today and your tests are completely normal, you will receive your results only by: MyChart Message (if you have MyChart) OR A paper copy in the mail If you have any lab test that is abnormal or we need to change your treatment, we will call you to review the results.   Testing/Procedures: None    Follow-Up: At [facility], you and your health needs are our priority.  As part of our continuing mission to provide you with exceptional heart care, we have created designated Provider Care Teams.  These Care Teams include your primary Cardiologist (physician) and Advanced Practice Providers (APPs -  Physician Assistants and Nurse Practitioners) who all work together to provide you with the care you need, when you need it.  We recommend signing up for the patient portal called "MyChart".  Sign up information is provided on this After Visit Summary.  MyChart is used to connect with patients for Virtual Visits (Telemedicine).  Patients are able to view lab/test results, encounter notes, upcoming appointments, etc.  Non-urgent messages can be sent to your provider as well.   To learn more about what you can do with MyChart, go to [email or web address].    Your next appointment:   6 month(s)  The format for your next appointment:   In Person  Provider:   Reatha Harps, MD    Other Instructions

## 2023-10-08 ENCOUNTER — Telehealth: Payer: Self-pay

## 2023-10-08 NOTE — Telephone Encounter (Signed)
 Contacted patient and patient verbalized understanding of negative Diatherix h pylori stool test.

## 2023-10-20 ENCOUNTER — Telehealth: Payer: Self-pay | Admitting: Nurse Practitioner

## 2023-10-20 DIAGNOSIS — Z1211 Encounter for screening for malignant neoplasm of colon: Secondary | ICD-10-CM

## 2023-10-20 DIAGNOSIS — R1012 Left upper quadrant pain: Secondary | ICD-10-CM

## 2023-10-20 DIAGNOSIS — K219 Gastro-esophageal reflux disease without esophagitis: Secondary | ICD-10-CM

## 2023-10-20 NOTE — Telephone Encounter (Signed)
 Contacted patient and patient verbalized understanding that he is cleared for endo and colon per cardiologist.

## 2023-10-20 NOTE — Telephone Encounter (Signed)
 DD, pls follow up on cardiac clearance, refer to office visit 09/22/2023. Patient underwent a cardiac catheterization 09/24/2023 and was subsequently seen by his cardiologist Dr. Rolm Clos 10/06/2023. Cardiac clearance required prior to scheduling EGD/colonoscopy with Dr. General Kenner. Pls let me know when cardiac clearance received. THX.

## 2023-10-20 NOTE — Telephone Encounter (Signed)
 Any updates on this patient's cardiac clearance?

## 2023-10-20 NOTE — Telephone Encounter (Signed)
   Patient Name: Derek Love  DOB: 01-08-1971 MRN: [id number]  Primary Cardiologist: Oneil Bigness, MD  Chart reviewed as part of pre-operative protocol coverage. Given past medical history and time since last visit, based on ACC/AHA guidelines, Derek Love is at acceptable risk for the planned procedure without further cardiovascular testing.   Patient was last seen in the office on 10/06/2023 by Dr. Rolm Clos and was stable from a cardiac standpoint.  Per Dr. Rolm Clos, "OK for colonoscopy."   I will route this recommendation to the requesting party via Epic fax function and remove from pre-op pool.  Please call with questions.  Jude Norton, NP 10/20/2023, 9:19 AM

## 2023-10-21 MED ORDER — NA SULFATE-K SULFATE-MG SULF 17.5-3.13-1.6 GM/177ML PO SOLN: 1.0000 | Freq: Once | ORAL | 0 refills | Status: AC

## 2023-10-21 NOTE — Telephone Encounter (Signed)
 Instructions were mailed and prep was sent to pharmacy.

## 2023-10-21 NOTE — Telephone Encounter (Signed)
 Cardiac letter is located under Encounter labeled "Clearance TBD".

## 2023-10-21 NOTE — Telephone Encounter (Signed)
 DD, Your note below indicated you received a cardiac clearance letter. Where is the cardiac clearance letter?

## 2023-10-21 NOTE — Telephone Encounter (Signed)
 DD, great, I reviewed cardiac clearance note. Pls contact the patient and schedule him for an EGD and colonoscopy in [facility] with Dr. General Kenner.  Thanks.

## 2023-10-21 NOTE — Telephone Encounter (Signed)
 Contacted patient and patient has been scheduled for 12/19/23 for endo and colonoscopy with [facility].

## 2023-10-21 NOTE — Addendum Note (Signed)
 Addended by: Kiela Shisler N on: 10/21/2023 01:27 PM   Modules accepted: Orders

## 2023-10-27 ENCOUNTER — Encounter: Payer: Self-pay | Admitting: Nurse Practitioner

## 2023-10-31 ENCOUNTER — Ambulatory Visit (INDEPENDENT_AMBULATORY_CARE_PROVIDER_SITE_OTHER): Payer: Self-pay

## 2023-10-31 DIAGNOSIS — J309 Allergic rhinitis, unspecified: Secondary | ICD-10-CM | POA: Diagnosis not present

## 2023-11-16 ENCOUNTER — Other Ambulatory Visit: Payer: Self-pay | Admitting: Allergy

## 2023-11-27 ENCOUNTER — Encounter: Payer: Self-pay | Admitting: Adult Health

## 2023-11-27 ENCOUNTER — Ambulatory Visit (INDEPENDENT_AMBULATORY_CARE_PROVIDER_SITE_OTHER): Admitting: Adult Health

## 2023-11-27 VITALS — BP 100/70 | HR 66 | Temp 97.4°F | Ht 72.0 in | Wt 221.0 lb

## 2023-11-27 DIAGNOSIS — M5416 Radiculopathy, lumbar region: Secondary | ICD-10-CM | POA: Diagnosis not present

## 2023-11-27 DIAGNOSIS — G5603 Carpal tunnel syndrome, bilateral upper limbs: Secondary | ICD-10-CM | POA: Diagnosis not present

## 2023-11-27 MED ORDER — PREDNISONE 10 MG PO TABS: ORAL_TABLET | ORAL | 0 refills | Status: DC

## 2023-11-27 NOTE — Progress Notes (Signed)
 Subjective:    Patient ID: Derek Love, male    DOB: [birth year], 53 y.o.   MRN: [id number]  HPI  Discussed the use of AI scribe software for clinical note transcription with the patient, who gave verbal consent to proceed.  History of Present Illness   Derek Love is a 53 year old male who presents with numbness and cold sensation in his fingers and toes.  Numbness and cold sensation are primarily in the tips of the fingers, especially the first three fingers of both hands, and to a lesser extent in the toes. Symptoms are worse in the early morning, around 6 AM, and can become painful. Dropping the arm alleviates the sensation.  Similar symptoms occurred in the past but were less severe. Symptoms began three weeks ago, coinciding with moving to a new house involving heavy lifting. He started Crestor  a few months ago for cholesterol management. No muscle soreness or joint pain in legs or thighs.  He has not been exercising recently and reports poor diet due to moving stress. He feels better with exercise, particularly CrossFit, and has no restrictions from his cardiologist. No back or neck pain, and no issues with grip strength or dropping objects.      Review of Systems See HPI   Past Medical History:  Diagnosis Date   ADHD (attention deficit hyperactivity disorder)    Allergic rhinitis    Asthma    Coronary artery disease    Eczema    GERD (gastroesophageal reflux disease)    Headache(784.0)    Hypertension    Positive PPD, treated     Social History   Socioeconomic History   Marital status: Married    Spouse name: Not on file   Number of children: 2   Years of education: Not on file   Highest education level: Not on file  Occupational History   Occupation: Aeronautical engineer  Tobacco Use   Smoking status: Never   Smokeless tobacco: Never  Vaping Use   Vaping status: Never Used  Substance and Sexual Activity   Alcohol use: Yes    Alcohol/week: 0.0 standard  drinks of alcohol    Comment: occ   Drug use: No   Sexual activity: Yes  Other Topics Concern   Not on file  Social History Narrative   Not on file   Social Drivers of Health   Financial Resource Strain: Not on file  Food Insecurity: Not on file  Transportation Needs: Not on file  Physical Activity: Not on file  Stress: Not on file  Social Connections: Unknown (11/13/2021)   Received from [facility], [facility]   Social Network    Social Network: Not on file  Intimate Partner Violence: Unknown (10/05/2021)   Received from [facility], [facility]   HITS    Physically Hurt: Not on file    Insult or Talk Down To: Not on file    Threaten Physical Harm: Not on file    Scream or Curse: Not on file    Past Surgical History:  Procedure Laterality Date   LEFT HEART CATH AND CORONARY ANGIOGRAPHY N/A 09/24/2023   Procedure: LEFT HEART CATH AND CORONARY ANGIOGRAPHY;  Surgeon: [place], Peter M, MD;  Location: [facility] INVASIVE CV LAB;  Service: Cardiovascular;  Laterality: N/A;   No Surgical History     UPPER GI ENDOSCOPY  2001    Family History  Problem Relation Age of Onset   Heart disease Father    Stroke Father  Colon polyps Father    Pancreatic cancer Father    Hypertrophic cardiomyopathy Sister    Allergic rhinitis Maternal Grandmother    Heart disease Paternal Grandfather    Coronary artery disease Other    Diabetes Other    Hyperlipidemia Other    Hypertension Other    Lung cancer Other    Stroke Other    Liver cancer Neg Hx    Colon cancer Neg Hx    Esophageal cancer Neg Hx     No Known Allergies  Current Outpatient Medications on File Prior to Visit  Medication Sig Dispense Refill   aspirin  EC 81 MG tablet Take 81 mg by mouth daily.     Carbinoxamine  Maleate (RYVENT ) 6 MG TABS TAKE 1 TABLET BY MOUTH EVERY MORNING AND TAKE 1 TABLET BY MOUTH EVERY NIGHT AT BEDTIME 60 tablet 5   clobetasol  ointment (TEMOVATE ) 0.05 % Apply 1 Application topically 2 (two)  times daily as needed (itchy rash). 60 g 5   montelukast  (SINGULAIR ) 10 MG tablet Take 1 tablet (10 mg total) by mouth at bedtime. 30 tablet 5   Na Sulfate-K Sulfate-Mg Sulfate concentrate (SUPREP) 17.5-3.13-1.6 GM/177ML SOLN      olmesartan  (BENICAR ) 40 MG tablet Take 1 tablet (40 mg total) by mouth daily. 90 tablet 3   Olopatadine -Mometasone (RYALTRIS ) 665-25 MCG/ACT SUSP Place 2 sprays into the nose 2 (two) times daily. 29 g 5   OVER THE COUNTER MEDICATION Place 1 drop into both eyes 2 (two) times daily. Lastacaft      pantoprazole  (PROTONIX ) 40 MG tablet TAKE 1 TABLET BY MOUTH DAILY 30 tablet 3   rosuvastatin  (CRESTOR ) 40 MG tablet Take 1 tablet (40 mg total) by mouth daily. 90 tablet 3   nitroGLYCERIN  (NITROSTAT ) 0.4 MG SL tablet Place 1 tablet (0.4 mg total) under the tongue every 5 (five) minutes as needed for chest pain. 90 tablet 3   No current facility-administered medications on file prior to visit.    BP 100/70   Pulse 66   Temp (!) 97.4 F (36.3 C) (Oral)   Ht 6' (1.829 m)   Wt 221 lb (100.2 kg)   SpO2 97%   BMI 29.97 kg/m       Objective:   Physical Exam Vitals and nursing note reviewed.  Constitutional:      Appearance: Normal appearance. He is overweight.  Cardiovascular:     Rate and Rhythm: Normal rate and regular rhythm.     Pulses: Normal pulses.          Radial pulses are 2+ on the right side and 2+ on the left side.       Popliteal pulses are 2+ on the right side and 2+ on the left side.       Dorsalis pedis pulses are 2+ on the right side and 2+ on the left side.       Posterior tibial pulses are 2+ on the right side and 2+ on the left side.     Heart sounds: Normal heart sounds.  Pulmonary:     Effort: Pulmonary effort is normal.     Breath sounds: Normal breath sounds.  Musculoskeletal:        General: Normal range of motion.  Skin:    General: Skin is warm and dry.  Neurological:     General: No focal deficit present.     Mental Status: He is  alert and oriented to person, place, and time.     Cranial Nerves:  Cranial nerves 2-12 are intact.     Sensory: Sensation is intact.     Motor: Motor function is intact.     Coordination: Coordination is intact.     Comments: Negative Tinels' sign and positive Phalen's test   Psychiatric:        Mood and Affect: Mood normal.        Behavior: Behavior normal.        Thought Content: Thought content normal.        Judgment: Judgment normal.        Assessment & Plan:  1. Bilateral carpal tunnel syndrome (Primary) - Discussed treatment options including cock up splints, oral steroids, or steroid injection.  He would like to try oral steroids first.  Advise follow-up if symptoms not improving after steroid therapy - predniSONE  (DELTASONE ) 10 MG tablet; Take two tablets for 7 days and then 1 tablet for 7 days  Dispense: 21 tablet; Refill: 0  2. Lumbar radiculopathy [facility] lumbar radiculopathy.  Will trial him on prednisone  for carpal tunnel.  If symptoms do not improve then look at doing x-ray and advanced imaging. - predniSONE  (DELTASONE ) 10 MG tablet; Take two tablets for 7 days and then 1 tablet for 7 days  Dispense: 21 tablet; Refill: 0  Alto Atta, NP  Time spent with patient today was 32 minutes which consisted of chart review, discussing carpal tunnel syndrome and lumbar radiculopathy, work up, treatment answering questions and documentation.

## 2023-12-01 ENCOUNTER — Ambulatory Visit (INDEPENDENT_AMBULATORY_CARE_PROVIDER_SITE_OTHER)

## 2023-12-01 DIAGNOSIS — J309 Allergic rhinitis, unspecified: Secondary | ICD-10-CM | POA: Diagnosis not present

## 2023-12-12 ENCOUNTER — Telehealth: Payer: Self-pay | Admitting: Nurse Practitioner

## 2023-12-12 NOTE — Telephone Encounter (Signed)
 Ok got it, no problem, thanks for letting me know. He was originally scheduled on August 20th or June 20th? Thanks

## 2023-12-12 NOTE — Telephone Encounter (Signed)
 Good morning Dr. General Kenner, I received a call from this patient needing to reschedule his procedure due to him having a family emergency and is needing to travel to [place]  due to his father being very sick. Patient was originally scheduled for August the 20 th. Patient was rescheduled for August the 12 th.

## 2023-12-12 NOTE — Telephone Encounter (Signed)
Got it, thanks for clarifying!

## 2023-12-17 ENCOUNTER — Other Ambulatory Visit: Payer: Self-pay | Admitting: Adult Health

## 2023-12-17 DIAGNOSIS — R1084 Generalized abdominal pain: Secondary | ICD-10-CM

## 2023-12-19 ENCOUNTER — Encounter: Admitting: Gastroenterology

## 2024-01-05 ENCOUNTER — Ambulatory Visit (INDEPENDENT_AMBULATORY_CARE_PROVIDER_SITE_OTHER)

## 2024-01-05 DIAGNOSIS — J309 Allergic rhinitis, unspecified: Secondary | ICD-10-CM

## 2024-02-03 ENCOUNTER — Ambulatory Visit (INDEPENDENT_AMBULATORY_CARE_PROVIDER_SITE_OTHER)

## 2024-02-03 DIAGNOSIS — J309 Allergic rhinitis, unspecified: Secondary | ICD-10-CM

## 2024-02-03 NOTE — Telephone Encounter (Signed)
 Good Afternoon Dr. Leigh this patient called and stated that he was unfortunately needing to reschedule his procedure schedule for August the 12 th due to his father dying from cancer. Patient was reschedule for October 24 th and also scheduled for a pre-visit with the nurse on October the 10 th. Please advise.

## 2024-02-04 NOTE — Telephone Encounter (Signed)
Okay thanks for letting me know

## 2024-02-10 ENCOUNTER — Encounter: Admitting: Gastroenterology

## 2024-02-16 DIAGNOSIS — J301 Allergic rhinitis due to pollen: Secondary | ICD-10-CM | POA: Diagnosis not present

## 2024-02-16 NOTE — Progress Notes (Signed)
 VIAL MADE ON 02/16/24

## 2024-03-09 ENCOUNTER — Ambulatory Visit (INDEPENDENT_AMBULATORY_CARE_PROVIDER_SITE_OTHER)

## 2024-03-09 DIAGNOSIS — J309 Allergic rhinitis, unspecified: Secondary | ICD-10-CM | POA: Diagnosis not present

## 2024-03-16 ENCOUNTER — Other Ambulatory Visit: Payer: Self-pay | Admitting: Adult Health

## 2024-04-06 ENCOUNTER — Ambulatory Visit: Admitting: Adult Health

## 2024-04-09 ENCOUNTER — Encounter: Payer: Self-pay | Admitting: Gastroenterology

## 2024-04-09 ENCOUNTER — Ambulatory Visit

## 2024-04-09 VITALS — Ht 72.0 in | Wt 225.0 lb

## 2024-04-09 DIAGNOSIS — K219 Gastro-esophageal reflux disease without esophagitis: Secondary | ICD-10-CM

## 2024-04-09 DIAGNOSIS — Z1211 Encounter for screening for malignant neoplasm of colon: Secondary | ICD-10-CM

## 2024-04-09 NOTE — Progress Notes (Signed)
 Pre visit completed via phone call; Patient verified name, DOB, and address; No egg or soy allergy known to patient;  No issues known to pt with past sedation with any surgeries or procedures; Patient denies ever being told they had issues or difficulty with intubation;  No FH of Malignant Hyperthermia; Pt is not on diet pills; Pt is not on home 02;  Pt is not on blood thinners;  Pt reports issues with constipation- patient advised to increase oral fluids/fruits-veggies/activity as tolerated; also advised  No A fib or A flutter Have any cardiac testing pending--NO Insurance verified during PV appt--- [employer] Pt can ambulate without assistance;  Pt denies use of chewing tobacco Discussed diabetic/weight loss medication holds; Discussed NSAID holds; Checked BMI to be less than 50; Patient reports he has a Suprep prep at home from last set of instructions (08/2023 -he had to cancel his procedure at that time) Patient's chart reviewed by Norleen Schillings CNRA prior to previsit and patient appropriate for the [facility]; Pre visit completed and red dot placed by patient's name on their procedure day (on provider's schedule);  Instructions sent to MyChart per patient request;

## 2024-04-13 ENCOUNTER — Other Ambulatory Visit: Payer: Self-pay | Admitting: Adult Health

## 2024-04-16 ENCOUNTER — Telehealth: Admitting: Physician Assistant

## 2024-04-16 DIAGNOSIS — B9689 Other specified bacterial agents as the cause of diseases classified elsewhere: Secondary | ICD-10-CM

## 2024-04-16 DIAGNOSIS — J019 Acute sinusitis, unspecified: Secondary | ICD-10-CM

## 2024-04-16 MED ORDER — AMOXICILLIN-POT CLAVULANATE 875-125 MG PO TABS: 1.0000 | ORAL_TABLET | Freq: Two times a day (BID) | ORAL | 0 refills | Status: DC

## 2024-04-16 NOTE — Patient Instructions (Signed)
 Derek Love, thank you for joining Derek CHRISTELLA Dickinson, PA-C for today's virtual visit.  While this provider is not your primary care provider (PCP), if your PCP is located in our provider database this encounter information will be shared with them immediately following your visit.   [facility] MyChart account gives you access to today's visit and all your visits, tests, and labs performed at [facility]  click here if you don't have a [facility] MyChart account or go to [email or web address]  Consent: (Patient) Derek Love provided verbal consent for this virtual visit at the beginning of the encounter.  Current Medications:  Current Outpatient Medications:    amoxicillin -clavulanate (AUGMENTIN ) 875-125 MG tablet, Take 1 tablet by mouth 2 (two) times daily., Disp: 14 tablet, Rfl: 0   aspirin  EC 81 MG tablet, Take 81 mg by mouth daily., Disp: , Rfl:    Carbinoxamine  Maleate (RYVENT ) 6 MG TABS, TAKE 1 TABLET BY MOUTH EVERY MORNING AND TAKE 1 TABLET BY MOUTH EVERY NIGHT AT BEDTIME (Patient taking differently: Take 1 tablet by mouth in the morning and at bedtime.), Disp: 60 tablet, Rfl: 5   clobetasol  ointment (TEMOVATE ) 0.05 %, Apply 1 Application topically 2 (two) times daily as needed (itchy rash)., Disp: 60 g, Rfl: 5   montelukast  (SINGULAIR ) 10 MG tablet, Take 1 tablet (10 mg total) by mouth at bedtime., Disp: 30 tablet, Rfl: 5   Na Sulfate-K Sulfate-Mg Sulfate concentrate (SUPREP) 17.5-3.13-1.6 GM/177ML SOLN, , Disp: , Rfl:    olmesartan  (BENICAR ) 40 MG tablet, Take 1 tablet (40 mg total) by mouth daily. Due for annual exam, Disp: 90 tablet, Rfl: 0   Olopatadine -Mometasone (RYALTRIS ) 665-25 MCG/ACT SUSP, Place 2 sprays into the nose 2 (two) times daily. (Patient not taking: Reported on 04/09/2024), Disp: 29 g, Rfl: 5   OVER THE COUNTER MEDICATION, Place 1 drop into both eyes 2 (two) times daily. Lastacaft  (Patient taking differently: Place 1 drop into both eyes  2 (two) times daily as needed. Lastacaft ), Disp: , Rfl:    pantoprazole  (PROTONIX ) 40 MG tablet, TAKE 1 TABLET BY MOUTH DAILY, Disp: 30 tablet, Rfl: 3   rosuvastatin  (CRESTOR ) 40 MG tablet, Take 1 tablet (40 mg total) by mouth daily., Disp: 90 tablet, Rfl: 3   Medications ordered in this encounter:  Meds ordered this encounter  Medications   amoxicillin -clavulanate (AUGMENTIN ) 875-125 MG tablet    Sig: Take 1 tablet by mouth 2 (two) times daily.    Dispense:  14 tablet    Refill:  0    Supervising Provider:   BLAISE ALEENE KIDD [[id number]]     *If you need refills on other medications prior to your next appointment, please contact your pharmacy*  Follow-Up: Call back or seek an in-person evaluation if the symptoms worsen or if the condition fails to improve as anticipated.  [facility] [phone]  Other Instructions Sinus Infection, Adult A sinus infection, also called sinusitis, is inflammation of your sinuses. Sinuses are hollow spaces in the bones around your face. Your sinuses are located: Around your eyes. In the middle of your forehead. Behind your nose. In your cheekbones. Mucus normally drains out of your sinuses. When your nasal tissues become inflamed or swollen, mucus can become trapped or blocked. This allows bacteria, viruses, and fungi to grow, which leads to infection. Most infections of the sinuses are caused by a virus. A sinus infection can develop quickly. It can last for up to 4 weeks (acute) or for more  than 12 weeks (chronic). A sinus infection often develops after a cold. What are the causes? This condition is caused by anything that creates swelling in the sinuses or stops mucus from draining. This includes: Allergies. Asthma. Infection from bacteria or viruses. Deformities or blockages in your nose or sinuses. Abnormal growths in the nose (nasal polyps). Pollutants, such as chemicals or irritants in the air. Infection from fungi. This is  rare. What increases the risk? You are more likely to develop this condition if you: Have a weak body defense system (immune system). Do a lot of swimming or diving. Overuse nasal sprays. Smoke. What are the signs or symptoms? The main symptoms of this condition are pain and a feeling of pressure around the affected sinuses. Other symptoms include: Stuffy nose or congestion that makes it difficult to breathe through your nose. Thick yellow or greenish drainage from your nose. Tenderness, swelling, and warmth over the affected sinuses. A cough that may get worse at night. Decreased sense of smell and taste. Extra mucus that collects in the throat or the back of the nose (postnasal drip) causing a sore throat or bad breath. Tiredness (fatigue). Fever. How is this diagnosed? This condition is diagnosed based on: Your symptoms. Your medical history. A physical exam. Tests to find out if your condition is acute or chronic. This may include: Checking your nose for nasal polyps. Viewing your sinuses using a device that has a light (endoscope). Testing for allergies or bacteria. Imaging tests, such as an MRI or CT scan. In rare cases, a bone biopsy may be done to rule out more serious types of fungal sinus disease. How is this treated? Treatment for a sinus infection depends on the cause and whether your condition is chronic or acute. If caused by a virus, your symptoms should go away on their own within 10 days. You may be given medicines to relieve symptoms. They include: Medicines that shrink swollen nasal passages (decongestants). A spray that eases inflammation of the nostrils (topical intranasal corticosteroids). Rinses that help get rid of thick mucus in your nose (nasal saline washes). Medicines that treat allergies (antihistamines). Over-the-counter pain relievers. If caused by bacteria, your health care provider may recommend waiting to see if your symptoms improve. Most  bacterial infections will get better without antibiotic medicine. You may be given antibiotics if you have: A severe infection. A weak immune system. If caused by narrow nasal passages or nasal polyps, surgery may be needed. Follow these instructions at home: Medicines Take, use, or apply over-the-counter and prescription medicines only as told by your health care provider. These may include nasal sprays. If you were prescribed an antibiotic medicine, take it as told by your health care provider. Do not stop taking the antibiotic even if you start to feel better. Hydrate and humidify  Drink enough fluid to keep your urine pale yellow. Staying hydrated will help to thin your mucus. Use a cool mist humidifier to keep the humidity level in your home above 50%. Inhale steam for 10-15 minutes, 3-4 times a day, or as told by your health care provider. You can do this in the bathroom while a hot shower is running. Limit your exposure to cool or dry air. Rest Rest as much as possible. Sleep with your head raised (elevated). Make sure you get enough sleep each night. General instructions  Apply a warm, moist washcloth to your face 3-4 times a day or as told by your health care provider. This will help  with discomfort. Use nasal saline washes as often as told by your health care provider. Wash your hands often with soap and water to reduce your exposure to germs. If soap and water are not available, use hand sanitizer. Do not smoke. Avoid being around people who are smoking (secondhand smoke). Keep all follow-up visits. This is important. Contact a health care provider if: You have a fever. Your symptoms get worse. Your symptoms do not improve within 10 days. Get help right away if: You have a severe headache. You have persistent vomiting. You have severe pain or swelling around your face or eyes. You have vision problems. You develop confusion. Your neck is stiff. You have trouble  breathing. These symptoms may be an emergency. Get help right away. Call 911. Do not wait to see if the symptoms will go away. Do not drive yourself to the hospital. Summary A sinus infection is soreness and inflammation of your sinuses. Sinuses are hollow spaces in the bones around your face. This condition is caused by nasal tissues that become inflamed or swollen. The swelling traps or blocks the flow of mucus. This allows bacteria, viruses, and fungi to grow, which leads to infection. If you were prescribed an antibiotic medicine, take it as told by your health care provider. Do not stop taking the antibiotic even if you start to feel better. Keep all follow-up visits. This is important. This information is not intended to replace advice given to you by your health care provider. Make sure you discuss any questions you have with your health care provider. Document Revised: 05/22/2021 Document Reviewed: 05/22/2021 Elsevier Patient Education  2024 Elsevier Inc.   If you have been instructed to have an in-person evaluation today at a local Urgent Care facility, please use the link below. It will take you to a list of all of our available [facility], including address, phone number and hours of operation. Please do not delay care.  [facility]  If you or a family member do not have a primary care provider, use the link below to schedule a visit and establish care. When you choose a [facility] primary care physician or advanced practice provider, you gain a long-term partner in health. Find a Primary Care Provider  Learn more about [facility] in-office and virtual care options: [place] - Get Care Now

## 2024-04-16 NOTE — Progress Notes (Signed)
 Virtual Visit Consent   Derek Love, you are scheduled for a virtual visit with a [facility] provider today. Just as with appointments in the office, your consent must be obtained to participate. Your consent will be active for this visit and any virtual visit you may have with one of our providers in the next 365 days. If you have a MyChart account, a copy of this consent can be sent to you electronically.  As this is a virtual visit, video technology does not allow for your provider to perform a traditional examination. This may limit your provider's ability to fully assess your condition. If your provider identifies any concerns that need to be evaluated in person or the need to arrange testing (such as labs, EKG, etc.), we will make arrangements to do so. Although advances in technology are sophisticated, we cannot ensure that it will always work on either your end or our end. If the connection with a video visit is poor, the visit may have to be switched to a telephone visit. With either a video or telephone visit, we are not always able to ensure that we have a secure connection.  By engaging in this virtual visit, you consent to the provision of healthcare and authorize for your insurance to be billed (if applicable) for the services provided during this visit. Depending on your insurance coverage, you may receive a charge related to this service.  I need to obtain your verbal consent now. Are you willing to proceed with your visit today? Derek Love has provided verbal consent on 04/16/2024 for a virtual visit (video or telephone). Derek Derek Dickinson, PA-C  Date: 04/16/2024 9:37 AM   Virtual Visit via Video Note   I, Derek Love, connected with  Derek Love  ([id number], 1971-01-27) on 04/16/24 at  9:30 AM EDT by a video-enabled telemedicine application and verified that I am speaking with the correct person using two identifiers.  Location: Patient: Virtual Visit  Location Patient: Mobile Provider: Virtual Visit Location Provider: Home Office   I discussed the limitations of evaluation and management by telemedicine and the availability of in person appointments. The patient expressed understanding and agreed to proceed.    History of Present Illness: Derek Love is a 53 y.o. who identifies as a male who was assigned male at birth, and is being seen today for sinus congestion.  HPI: URI  This is a new problem. The current episode started 1 to 4 weeks ago (3 weeks). The problem has been gradually worsening. Maximum temperature: low grade fever in the beginning. The fever has been present for 1 to 2 days. Associated symptoms include congestion, coughing (from post nasal drainage), diarrhea, headaches, a plugged ear sensation, rhinorrhea (and post nasal drainage), sinus pain and a sore throat (from coughing; worse in the beginning). Pertinent negatives include no ear pain, nausea, vomiting or wheezing. Treatments tried: Mucinex max nighttime and daytime. The treatment provided mild relief.     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   Seasonal allergies 09/28/2018   Chronic right upper quadrant pain 08/25/2018   SEBACEOUS CYST, NECK 02/15/2009   Disorder of skin or subcutaneous tissue 02/15/2009   PALPITATIONS, RECURRENT 02/15/2009   LOW BACK PAIN, ACUTE 01/05/2008   Attention deficit hyperactivity disorder (ADHD) 10/20/2007   Essential hypertension 10/20/2007   Allergic rhinitis 10/20/2007   Asthma 10/20/2007   GERD 10/20/2007  Headache 10/20/2007   POSITIVE PPD 10/20/2007    Allergies: No Known Allergies Medications:  Current Outpatient Medications:    amoxicillin -clavulanate (AUGMENTIN ) 875-125 MG tablet, Take 1 tablet by mouth 2 (two) times daily., Disp: 14 tablet, Rfl: 0   aspirin  EC 81 MG tablet, Take 81 mg by mouth daily., Disp: , Rfl:     Carbinoxamine  Maleate (RYVENT ) 6 MG TABS, TAKE 1 TABLET BY MOUTH EVERY MORNING AND TAKE 1 TABLET BY MOUTH EVERY NIGHT AT BEDTIME (Patient taking differently: Take 1 tablet by mouth in the morning and at bedtime.), Disp: 60 tablet, Rfl: 5   clobetasol  ointment (TEMOVATE ) 0.05 %, Apply 1 Application topically 2 (two) times daily as needed (itchy rash)., Disp: 60 g, Rfl: 5   montelukast  (SINGULAIR ) 10 MG tablet, Take 1 tablet (10 mg total) by mouth at bedtime., Disp: 30 tablet, Rfl: 5   Na Sulfate-K Sulfate-Mg Sulfate concentrate (SUPREP) 17.5-3.13-1.6 GM/177ML SOLN, , Disp: , Rfl:    olmesartan  (BENICAR ) 40 MG tablet, Take 1 tablet (40 mg total) by mouth daily. Due for annual exam, Disp: 90 tablet, Rfl: 0   Olopatadine -Mometasone (RYALTRIS ) 665-25 MCG/ACT SUSP, Place 2 sprays into the nose 2 (two) times daily. (Patient not taking: Reported on 04/09/2024), Disp: 29 g, Rfl: 5   OVER THE COUNTER MEDICATION, Place 1 drop into both eyes 2 (two) times daily. Lastacaft  (Patient taking differently: Place 1 drop into both eyes 2 (two) times daily as needed. Lastacaft ), Disp: , Rfl:    pantoprazole  (PROTONIX ) 40 MG tablet, TAKE 1 TABLET BY MOUTH DAILY, Disp: 30 tablet, Rfl: 3   rosuvastatin  (CRESTOR ) 40 MG tablet, Take 1 tablet (40 mg total) by mouth daily., Disp: 90 tablet, Rfl: 3  Observations/Objective: Patient is well-developed, well-nourished in no acute distress.  Resting comfortably  Head is normocephalic, atraumatic.  No labored breathing.  Speech is clear and coherent with logical content.  Patient is alert and oriented at baseline.    Assessment and Plan: 1. Acute bacterial sinusitis (Primary) - amoxicillin -clavulanate (AUGMENTIN ) 875-125 MG tablet; Take 1 tablet by mouth 2 (two) times daily.  Dispense: 14 tablet; Refill: 0  - Worsening symptoms that have not responded to OTC medications.  - Will give Augmentin  - Continue allergy medications.  - Steam and humidifier can help - Stay well  hydrated and get plenty of rest.  - Seek in person evaluation if no symptom improvement or if symptoms worsen   Follow Up Instructions: I discussed the assessment and treatment plan with the patient. The patient was provided an opportunity to ask questions and all were answered. The patient agreed with the plan and demonstrated an understanding of the instructions.  A copy of instructions were sent to the patient via MyChart unless otherwise noted below.     The patient was advised to call back or seek an in-person evaluation if the symptoms worsen or if the condition fails to improve as anticipated.    Derek Derek Dickinson, PA-C

## 2024-04-17 ENCOUNTER — Other Ambulatory Visit: Payer: Self-pay | Admitting: Adult Health

## 2024-04-17 DIAGNOSIS — R1084 Generalized abdominal pain: Secondary | ICD-10-CM

## 2024-04-23 ENCOUNTER — Encounter: Payer: Self-pay | Admitting: Gastroenterology

## 2024-04-23 ENCOUNTER — Ambulatory Visit: Admitting: Gastroenterology

## 2024-04-23 VITALS — BP 122/75 | HR 61 | Temp 97.7°F | Resp 20 | Ht 72.0 in | Wt 225.0 lb

## 2024-04-23 DIAGNOSIS — K319 Disease of stomach and duodenum, unspecified: Secondary | ICD-10-CM | POA: Diagnosis not present

## 2024-04-23 DIAGNOSIS — K219 Gastro-esophageal reflux disease without esophagitis: Secondary | ICD-10-CM | POA: Diagnosis not present

## 2024-04-23 DIAGNOSIS — K3189 Other diseases of stomach and duodenum: Secondary | ICD-10-CM

## 2024-04-23 DIAGNOSIS — D123 Benign neoplasm of transverse colon: Secondary | ICD-10-CM

## 2024-04-23 DIAGNOSIS — K449 Diaphragmatic hernia without obstruction or gangrene: Secondary | ICD-10-CM

## 2024-04-23 DIAGNOSIS — K648 Other hemorrhoids: Secondary | ICD-10-CM | POA: Diagnosis not present

## 2024-04-23 DIAGNOSIS — R1010 Upper abdominal pain, unspecified: Secondary | ICD-10-CM

## 2024-04-23 DIAGNOSIS — D125 Benign neoplasm of sigmoid colon: Secondary | ICD-10-CM

## 2024-04-23 DIAGNOSIS — Z1211 Encounter for screening for malignant neoplasm of colon: Secondary | ICD-10-CM | POA: Diagnosis present

## 2024-04-23 DIAGNOSIS — D124 Benign neoplasm of descending colon: Secondary | ICD-10-CM | POA: Diagnosis not present

## 2024-04-23 MED ORDER — SODIUM CHLORIDE 0.9 % IV SOLN: 500.0000 mL | Freq: Once | INTRAVENOUS | Status: DC

## 2024-04-23 NOTE — Progress Notes (Signed)
 Report given to PACU, vss

## 2024-04-23 NOTE — Op Note (Signed)
 [facility] Patient Name: Derek Love Procedure Date: 04/23/2024 9:06 AM MRN: [id number] Endoscopist: Derek Love , MD, [id number] Age: 53 Referring MD:  Date of Birth: 08/27/1970 Gender: Male Account #: [id number] Procedure:                Upper GI endoscopy Indications:              Upper abdominal pain - post prandial, mostly LUQ -                            somewhat improved on protonix , history of                            gastro-esophageal reflux disease Medicines:                Monitored Anesthesia Care Procedure:                Pre-Anesthesia Assessment:                           - Prior to the procedure, a History and Physical                            was performed, and patient medications and                            allergies were reviewed. The patient's tolerance of                            previous anesthesia was also reviewed. The risks                            and benefits of the procedure and the sedation                            options and risks were discussed with the patient.                            All questions were answered, and informed consent                            was obtained. Prior Anticoagulants: The patient has                            taken no anticoagulant or antiplatelet agents. ASA                            Grade Assessment: III - A patient with severe                            systemic disease. After reviewing the risks and                            benefits, the patient was deemed in satisfactory  condition to undergo the procedure.                           After obtaining informed consent, the endoscope was                            passed under direct vision. Throughout the                            procedure, the patient's blood pressure, pulse, and                            oxygen saturations were monitored continuously. The                            Olympus scope  [id number] was introduced through the                            mouth, and advanced to the second part of duodenum.                            The upper GI endoscopy was accomplished without                            difficulty. The patient tolerated the procedure                            well. Scope In: Scope Out: Findings:                 Esophagogastric landmarks were identified: the                            Z-line was found at 39 cm, the gastroesophageal                            junction was found at 39 cm and the upper extent of                            the gastric folds was found at 40 cm from the                            incisors.                           A 1 cm hiatal hernia was present.                           The exam of the esophagus was otherwise normal.                           Diffuse mucosal changes were found in the gastric                            fundus and in the  gastric body - cobblestone                            appearance with patchy erythema. Changes were noted                            diffusely in the gastric body and fundus, similar                            in appearance to an exam 05/2019 where biopsies                            showed nonspecific gastritis. Biopsies were taken                            with a cold forceps for histology from multiple                            areas.                           The exam of the stomach was otherwise normal.                           The examined duodenum was normal. Complications:            No immediate complications. Estimated blood loss:                            Minimal. Estimated Blood Loss:     Estimated blood loss was minimal. Impression:               - Esophagogastric landmarks identified.                           - 1 cm hiatal hernia.                           - Normal esophagus otherwise.                           - Mucosal changes in the gastric fundus and gastric                             body as outlined. Biopsied.                           - Normal examined duodenum. Recommendation:           - Patient has a contact number available for                            emergencies. The signs and symptoms of potential                            delayed complications were discussed with the  patient. Return to normal activities tomorrow.                            Written discharge instructions were provided to the                            patient.                           - Resume previous diet.                           - Continue present medications.                           - Trial of increasing protonix  to twice daily                            dosing to see if that helps symptoms.                           - Minimize NSAID use (if taking routinely)                           - Await pathology results. Derek P. Liev [name], MD 04/23/2024 9:49:22 AM This report has been signed electronically.

## 2024-04-23 NOTE — Progress Notes (Signed)
 Called to room to assist during endoscopic procedure.  Patient ID and intended procedure confirmed with present staff. Received instructions for my participation in the procedure from the performing physician.

## 2024-04-23 NOTE — Progress Notes (Signed)
0910 Robinul 0.1 mg IV given due large amount of secretions upon assessment.  MD made aware, vss 

## 2024-04-23 NOTE — Progress Notes (Signed)
 [facility] History and Physical   Primary Care Physician:  Merna Huxley, NP   Reason for Procedure:   GERD / upper abdominal pain, colon cancer screening  Plan:    EGD and colonoscopy     HPI: Derek Love is a 53 y.o. male  here for EGD and colonoscopy - upper abdominal pain / GERD - on protonix , EGD to further evaluate. First time colonoscopy. Father had colon polyps. Cleared by cardiology for these exams, sp cardiac cath.   Patient denies any bowel symptoms at this time. No family history of colon cancer known. Otherwise feels well without any cardiopulmonary symptoms.   I have discussed risks / benefits of anesthesia and endoscopic procedure with Eric Shady and they wish to proceed with the exams as outlined today.    Past Medical History:  Diagnosis Date   ADHD (attention deficit hyperactivity disorder)    Allergic rhinitis    Asthma    Coronary artery disease    Eczema    GERD (gastroesophageal reflux disease)    Headache(784.0)    Hypertension    Positive PPD, treated     Past Surgical History:  Procedure Laterality Date   LEFT HEART CATH AND CORONARY ANGIOGRAPHY N/A 09/24/2023   Procedure: LEFT HEART CATH AND CORONARY ANGIOGRAPHY;  Surgeon: [place]azil[place], Peter M, MD;  Location: [facility] INVASIVE CV LAB;  Service: Cardiovascular;  Laterality: N/A;   UPPER GI ENDOSCOPY  2001    Prior to Admission medications   Medication Sig Start Date End Date Taking? Authorizing Provider  amoxicillin -clavulanate (AUGMENTIN ) 875-125 MG tablet Take 1 tablet by mouth 2 (two) times daily. 04/16/24  Yes Burnette, Delon HERO, PA-C  Carbinoxamine  Maleate (RYVENT ) 6 MG TABS TAKE 1 TABLET BY MOUTH EVERY MORNING AND TAKE 1 TABLET BY MOUTH EVERY NIGHT AT BEDTIME Patient taking differently: Take 1 tablet by mouth in the morning and at bedtime. 11/18/23  Yes Padgett, Danita Macintosh, MD  clobetasol  ointment (TEMOVATE ) 0.05 % Apply 1 Application topically 2 (two) times daily as  needed (itchy rash). 05/28/23  Yes Padgett, Danita Macintosh, MD  montelukast  (SINGULAIR ) 10 MG tablet Take 1 tablet (10 mg total) by mouth at bedtime. 09/18/23  Yes Padgett, Danita Macintosh, MD  olmesartan  (BENICAR ) 40 MG tablet Take 1 tablet (40 mg total) by mouth daily. Due for annual exam 04/13/24  Yes Nafziger, Huxley, NP  Olopatadine -Mometasone (RYALTRIS ) 665-25 MCG/ACT SUSP Place 2 sprays into the nose 2 (two) times daily. 05/28/23  Yes Padgett, Danita Macintosh, MD  pantoprazole  (PROTONIX ) 40 MG tablet TAKE 1 TABLET BY MOUTH DAILY 04/20/24  Yes Nafziger, Huxley, NP  rosuvastatin  (CRESTOR ) 40 MG tablet Take 1 tablet (40 mg total) by mouth daily. 05/20/23  Yes Nafziger, Huxley, NP  aspirin  EC 81 MG tablet Take 81 mg by mouth daily. 07/28/19   [provider]  OVER THE COUNTER MEDICATION Place 1 drop into both eyes 2 (two) times daily. Lastacaft  Patient taking differently: Place 1 drop into both eyes 2 (two) times daily as needed. Lastacaft     [provider]    Current Outpatient Medications  Medication Sig Dispense Refill   amoxicillin -clavulanate (AUGMENTIN ) 875-125 MG tablet Take 1 tablet by mouth 2 (two) times daily. 14 tablet 0   Carbinoxamine  Maleate (RYVENT ) 6 MG TABS TAKE 1 TABLET BY MOUTH EVERY MORNING AND TAKE 1 TABLET BY MOUTH EVERY NIGHT AT BEDTIME (Patient taking differently: Take 1 tablet by mouth in the morning and at bedtime.) 60 tablet 5   clobetasol  ointment (TEMOVATE )  0.05 % Apply 1 Application topically 2 (two) times daily as needed (itchy rash). 60 g 5   montelukast  (SINGULAIR ) 10 MG tablet Take 1 tablet (10 mg total) by mouth at bedtime. 30 tablet 5   olmesartan  (BENICAR ) 40 MG tablet Take 1 tablet (40 mg total) by mouth daily. Due for annual exam 90 tablet 0   Olopatadine -Mometasone (RYALTRIS ) 665-25 MCG/ACT SUSP Place 2 sprays into the nose 2 (two) times daily. 29 g 5   pantoprazole  (PROTONIX ) 40 MG tablet TAKE 1 TABLET BY MOUTH DAILY 30 tablet 3    rosuvastatin  (CRESTOR ) 40 MG tablet Take 1 tablet (40 mg total) by mouth daily. 90 tablet 3   aspirin  EC 81 MG tablet Take 81 mg by mouth daily.     OVER THE COUNTER MEDICATION Place 1 drop into both eyes 2 (two) times daily. Lastacaft  (Patient taking differently: Place 1 drop into both eyes 2 (two) times daily as needed. Lastacaft )     Current Facility-Administered Medications  Medication Dose Route Frequency Provider Last Rate Last Admin   0.9 %  sodium chloride  infusion  500 mL Intravenous Once Reinhart Saulters, Elspeth SQUIBB, MD        Allergies as of 04/23/2024   (No Known Allergies)    Family History  Problem Relation Age of Onset   Colon polyps Mother 57   Heart disease Father    Stroke Father    Colon polyps Father 18   Pancreatic cancer Father 20   Hypertrophic cardiomyopathy Sister    Allergic rhinitis Maternal Grandmother    Heart disease Paternal Grandfather    Coronary artery disease Other    Diabetes Other    Hyperlipidemia Other    Hypertension Other    Lung cancer Other    Stroke Other    Liver cancer Neg Hx    Colon cancer Neg Hx    Esophageal cancer Neg Hx    Rectal cancer Neg Hx    Stomach cancer Neg Hx     Social History   Socioeconomic History   Marital status: Married    Spouse name: Not on file   Number of children: 2   Years of education: Not on file   Highest education level: Not on file  Occupational History   Occupation: Aeronautical engineer  Tobacco Use   Smoking status: Never   Smokeless tobacco: Never  Vaping Use   Vaping status: Never Used  Substance and Sexual Activity   Alcohol use: Yes    Alcohol/week: 6.0 standard drinks of alcohol    Types: 6 Standard drinks or equivalent per week    Comment: occ   Drug use: No   Sexual activity: Yes  Other Topics Concern   Not on file  Social History Narrative   Not on file   Social Drivers of Health   Financial Resource Strain: Not on file  Food Insecurity: Not on file  Transportation Needs: Not  on file  Physical Activity: Not on file  Stress: Not on file  Social Connections: Unknown (11/13/2021)   Received [facility]   Social Network    Social Network: Not on file  Intimate Partner Violence: Unknown (10/05/2021)   Received from [facility]   HITS    Physically Hurt: Not on file    Insult or Talk Down To: Not on file    Threaten Physical Harm: Not on file    Scream or Curse: Not on file    Review of Systems: All other  review of systems negative except as mentioned in the HPI.  Physical Exam: Vital signs BP (!) 136/54   Pulse 61   Temp 97.7 F (36.5 C)   Ht 6' (1.829 m)   Wt 225 lb (102.1 kg)   SpO2 97%   BMI 30.52 kg/m   General:   Alert,  Well-developed, pleasant and cooperative in NAD Lungs:  Clear throughout to auscultation.   Heart:  Regular rate and rhythm Abdomen:  Soft, nontender and nondistended.   Neuro/Psych:  Alert and cooperative. Normal mood and affect. A and O x 3  Marcey Naval, MD [facility]

## 2024-04-23 NOTE — Patient Instructions (Signed)
 Educational handout provided to patient related to Hemorrhoids, Polyps, Hiatal hernia  Resume previous diet  Continue present medications  Awaiting pathology results   YOU HAD AN ENDOSCOPIC PROCEDURE TODAY AT THE [facility]:   Refer to the procedure report that was given to you for any specific questions about what was found during the examination.  If the procedure report does not answer your questions, please call your gastroenterologist to clarify.  If you requested that your care partner not be given the details of your procedure findings, then the procedure report has been included in a sealed envelope for you to review at your convenience later.  YOU SHOULD EXPECT: Some feelings of bloating in the abdomen. Passage of more gas than usual.  Walking can help get rid of the air that was put into your GI tract during the procedure and reduce the bloating. If you had a lower endoscopy (such as a colonoscopy or flexible sigmoidoscopy) you may notice spotting of blood in your stool or on the toilet paper. If you underwent a bowel prep for your procedure, you may not have a normal bowel movement for a few days.  Please Note:  You might notice some irritation and congestion in your nose or some drainage.  This is from the oxygen used during your procedure.  There is no need for concern and it should clear up in a day or so.  SYMPTOMS TO REPORT IMMEDIATELY:  Following lower endoscopy (colonoscopy or flexible sigmoidoscopy):  Excessive amounts of blood in the stool  Significant tenderness or worsening of abdominal pains  Swelling of the abdomen that is new, acute  Fever of 100F or higher  Following upper endoscopy (EGD)  Vomiting of blood or coffee ground material  New chest pain or pain under the shoulder blades  Painful or persistently difficult swallowing  New shortness of breath  Fever of 100F or higher  Black, tarry-looking stools  For urgent or emergent issues, a  gastroenterologist can be reached at any hour by calling [phone]. Do not use MyChart messaging for urgent concerns.    DIET:  We do recommend a small meal at first, but then you may proceed to your regular diet.  Drink plenty of fluids but you should avoid alcoholic beverages for 24 hours.  ACTIVITY:  You should plan to take it easy for the rest of today and you should NOT DRIVE or use heavy machinery until tomorrow (because of the sedation medicines used during the test).    FOLLOW UP: Our staff will call the number listed on your records the next business day following your procedure.  We will call around 7:15- 8:00 am to check on you and address any questions or concerns that you may have regarding the information given to you following your procedure. If we do not reach you, we will leave a message.     If any biopsies were taken you will be contacted by phone or by letter within the next 1-3 weeks.  Please call us  at [phone] if you have not heard about the biopsies in 3 weeks.    SIGNATURES/CONFIDENTIALITY: You and/or your care partner have signed paperwork which will be entered into your electronic medical record.  These signatures attest to the fact that that the information above on your After Visit Summary has been reviewed and is understood.  Full responsibility of the confidentiality of this discharge information lies with you and/or your care-partner.

## 2024-04-23 NOTE — Op Note (Signed)
 [facility] Patient Name: Derek Love Procedure Date: 04/23/2024 9:01 AM MRN: [id number] Endoscopist: Elspeth P. Leigh , MD, [id number] Age: 53 Referring MD:  Date of Birth: [birth year] Gender: Male Account #: [id number] Procedure:                Colonoscopy Indications:              Screening for colorectal malignant neoplasm, This                            is the patient's first colonoscopy Medicines:                Monitored Anesthesia Care Procedure:                Pre-Anesthesia Assessment:                           - Prior to the procedure, a History and Physical                            was performed, and patient medications and                            allergies were reviewed. The patient's tolerance of                            previous anesthesia was also reviewed. The risks                            and benefits of the procedure and the sedation                            options and risks were discussed with the patient.                            All questions were answered, and informed consent                            was obtained. Prior Anticoagulants: The patient has                            taken no anticoagulant or antiplatelet agents. ASA                            Grade Assessment: III - A patient with severe                            systemic disease. After reviewing the risks and                            benefits, the patient was deemed in satisfactory                            condition to undergo the procedure.  After obtaining informed consent, the colonoscope                            was passed under direct vision. Throughout the                            procedure, the patient's blood pressure, pulse, and                            oxygen saturations were monitored continuously. The                            Olympus Scope [id number] was introduced through the                            anus and  advanced to the the cecum, identified by                            appendiceal orifice and ileocecal valve. The                            colonoscopy was performed without difficulty. The                            patient tolerated the procedure well. The quality                            of the bowel preparation was good. The ileocecal                            valve, appendiceal orifice, and rectum were                            photographed. Scope In: 9:22:57 AM Scope Out: 9:39:49 AM Scope Withdrawal Time: 0 hours 13 minutes 59 seconds  Total Procedure Duration: 0 hours 16 minutes 52 seconds  Findings:                 The perianal and digital rectal examinations were                            normal.                           Two sessile polyps were found in the transverse                            colon. The polyps were 3 to 4 mm in size. These                            polyps were removed with a cold snare. Resection                            and retrieval were complete.  A 2 to 3 mm polyp was found in the descending                            colon. The polyp was sessile. The polyp was removed                            with a cold snare. Resection and retrieval were                            complete.                           A 3 mm polyp was found in the sigmoid colon. The                            polyp was sessile. The polyp was removed with a                            cold snare. Resection and retrieval were complete.                           Internal hemorrhoids were found during retroflexion.                           The exam was otherwise without abnormality. Complications:            No immediate complications. Estimated blood loss:                            Minimal. Estimated Blood Loss:     Estimated blood loss was minimal. Impression:               - Two 3 to 4 mm polyps in the transverse colon,                             removed with a cold snare. Resected and retrieved.                           - One 2 to 3 mm polyp in the descending colon,                            removed with a cold snare. Resected and retrieved.                           - One 3 mm polyp in the sigmoid colon, removed with                            a cold snare. Resected and retrieved.                           - Internal hemorrhoids.                           - The  examination was otherwise normal. Recommendation:           - Patient has a contact number available for                            emergencies. The signs and symptoms of potential                            delayed complications were discussed with the                            patient. Return to normal activities tomorrow.                            Written discharge instructions were provided to the                            patient.                           - Resume previous diet.                           - Continue present medications.                           - Await pathology results. Elspeth P. Laurice Kimmons, MD 04/23/2024 9:44:41 AM This report has been signed electronically.

## 2024-04-26 ENCOUNTER — Telehealth: Payer: Self-pay

## 2024-04-26 NOTE — Telephone Encounter (Signed)
  Follow up Call-     04/23/2024    8:24 AM  Call back number  Post procedure Call Back phone  # [phone]  Permission to leave phone message Yes     Patient questions:  Do you have a fever, pain , or abdominal swelling? No. Pain Score  0 *  Have you tolerated food without any problems? Yes.    Have you been able to return to your normal activities? Yes.    Do you have any questions about your discharge instructions: Diet   No. Medications  No. Follow up visit  No.  Do you have questions or concerns about your Care? No.  Actions: * If pain score is 4 or above: No action needed, pain <4.

## 2024-04-30 LAB — SURGICAL PATHOLOGY

## 2024-05-02 ENCOUNTER — Ambulatory Visit: Payer: Self-pay | Admitting: Gastroenterology

## 2024-05-02 DIAGNOSIS — K2970 Gastritis, unspecified, without bleeding: Secondary | ICD-10-CM

## 2024-05-03 ENCOUNTER — Ambulatory Visit (INDEPENDENT_AMBULATORY_CARE_PROVIDER_SITE_OTHER)

## 2024-05-03 DIAGNOSIS — J309 Allergic rhinitis, unspecified: Secondary | ICD-10-CM | POA: Diagnosis not present

## 2024-05-06 ENCOUNTER — Telehealth: Payer: Self-pay

## 2024-05-06 ENCOUNTER — Other Ambulatory Visit (INDEPENDENT_AMBULATORY_CARE_PROVIDER_SITE_OTHER)

## 2024-05-06 ENCOUNTER — Other Ambulatory Visit: Payer: Self-pay

## 2024-05-06 DIAGNOSIS — K2970 Gastritis, unspecified, without bleeding: Secondary | ICD-10-CM | POA: Diagnosis not present

## 2024-05-06 NOTE — Telephone Encounter (Signed)
-----   Message from Almarie GRADE Rumple sent at 05/06/2024 11:19 AM EST ----- Regarding: patient orders Good morning, I have a patient that has 2 orders and I can figure out the instinsic factor but the other test was ordered wrong it needs to be reordered by [facility] or [facility]. The resulting agency is [facility] and that's wrong. Please change the order and he said he will be back tomorrow. Thanks, Jamee

## 2024-05-06 NOTE — Telephone Encounter (Signed)
 Thank you.  New order placed for anti-parietal Antibody with [facility] resulting agency.  Sorry about that

## 2024-05-07 ENCOUNTER — Other Ambulatory Visit

## 2024-05-09 LAB — INTRINSIC FACTOR BLOCKING ANTIBODY: Intrinsic Factor: NEGATIVE

## 2024-05-11 ENCOUNTER — Ambulatory Visit: Admitting: Adult Health

## 2024-05-13 LAB — ANTI-PARIETAL ANTIBODY: PARIETAL CELL AB SCREEN: NEGATIVE

## 2024-05-15 ENCOUNTER — Ambulatory Visit: Payer: Self-pay | Admitting: Gastroenterology

## 2024-05-20 ENCOUNTER — Other Ambulatory Visit: Payer: Self-pay | Admitting: Allergy

## 2024-05-20 DIAGNOSIS — E785 Hyperlipidemia, unspecified: Secondary | ICD-10-CM

## 2024-06-01 ENCOUNTER — Ambulatory Visit

## 2024-06-01 DIAGNOSIS — J309 Allergic rhinitis, unspecified: Secondary | ICD-10-CM

## 2024-06-03 ENCOUNTER — Encounter: Admitting: Adult Health

## 2024-06-07 ENCOUNTER — Ambulatory Visit (INDEPENDENT_AMBULATORY_CARE_PROVIDER_SITE_OTHER)

## 2024-06-07 DIAGNOSIS — J309 Allergic rhinitis, unspecified: Secondary | ICD-10-CM | POA: Diagnosis not present

## 2024-06-08 ENCOUNTER — Encounter: Admitting: Adult Health

## 2024-06-11 ENCOUNTER — Other Ambulatory Visit: Payer: Self-pay | Admitting: Allergy

## 2024-06-14 ENCOUNTER — Ambulatory Visit

## 2024-06-14 DIAGNOSIS — J309 Allergic rhinitis, unspecified: Secondary | ICD-10-CM | POA: Diagnosis not present

## 2024-06-18 ENCOUNTER — Telehealth: Payer: Self-pay | Admitting: Allergy

## 2024-06-18 ENCOUNTER — Other Ambulatory Visit: Payer: Self-pay | Admitting: Allergy

## 2024-06-18 DIAGNOSIS — J309 Allergic rhinitis, unspecified: Secondary | ICD-10-CM

## 2024-06-18 MED ORDER — RYALTRIS 665-25 MCG/ACT NA SUSP: 2.0000 | Freq: Two times a day (BID) | NASAL | 0 refills | Status: DC

## 2024-06-18 NOTE — Telephone Encounter (Signed)
 Patient called that he was requesting a refill for Olopatadine  nasal spray he has not been since in the office since November 2024. The pharmacy notified him that the refill was denied because he needed an office visit with [facility].He did make an office visit appointment on January 8th 2026. I informed him that it would be a courtesy refill and that he had to come to his appointment or he will no longer be getting refills for his medication.

## 2024-06-19 ENCOUNTER — Other Ambulatory Visit: Payer: Self-pay | Admitting: Allergy

## 2024-06-28 MED ORDER — RYALTRIS 665-25 MCG/ACT NA SUSP: 2.0000 | Freq: Two times a day (BID) | NASAL | 0 refills | Status: DC

## 2024-07-03 ENCOUNTER — Other Ambulatory Visit: Payer: Self-pay | Admitting: Allergy

## 2024-07-08 ENCOUNTER — Ambulatory Visit (INDEPENDENT_AMBULATORY_CARE_PROVIDER_SITE_OTHER): Admitting: Family Medicine

## 2024-07-08 ENCOUNTER — Ambulatory Visit

## 2024-07-08 ENCOUNTER — Encounter: Payer: Self-pay | Admitting: Family Medicine

## 2024-07-08 ENCOUNTER — Other Ambulatory Visit: Payer: Self-pay

## 2024-07-08 VITALS — BP 122/70 | HR 89 | Temp 98.2°F

## 2024-07-08 DIAGNOSIS — R220 Localized swelling, mass and lump, head: Secondary | ICD-10-CM | POA: Diagnosis not present

## 2024-07-08 DIAGNOSIS — H1013 Acute atopic conjunctivitis, bilateral: Secondary | ICD-10-CM | POA: Diagnosis not present

## 2024-07-08 DIAGNOSIS — J301 Allergic rhinitis due to pollen: Secondary | ICD-10-CM | POA: Diagnosis not present

## 2024-07-08 DIAGNOSIS — L239 Allergic contact dermatitis, unspecified cause: Secondary | ICD-10-CM | POA: Diagnosis not present

## 2024-07-08 DIAGNOSIS — J4520 Mild intermittent asthma, uncomplicated: Secondary | ICD-10-CM | POA: Insufficient documentation

## 2024-07-08 DIAGNOSIS — J302 Other seasonal allergic rhinitis: Secondary | ICD-10-CM

## 2024-07-08 MED ORDER — MONTELUKAST SODIUM 10 MG PO TABS: 10.0000 mg | ORAL_TABLET | Freq: Every day | ORAL | 5 refills | Status: AC

## 2024-07-08 MED ORDER — CARBINOXAMINE MALEATE 6 MG PO TABS: 1.0000 | ORAL_TABLET | Freq: Two times a day (BID) | ORAL | 5 refills | Status: AC

## 2024-07-08 MED ORDER — RYALTRIS 665-25 MCG/ACT NA SUSP: 2.0000 | Freq: Two times a day (BID) | NASAL | 0 refills | Status: AC

## 2024-07-08 NOTE — Patient Instructions (Addendum)
 Asthma Continue montelukast  10 mg once a day to prevent cough or wheeze Continue albuterol  2 puffs once every 4 hours if needed for cough or wheeze You may use albuterol  2 puffs 5-15 minutes before activity to decrease cough or wheeze   Allergic rhinitis Continue allergen avoidance measures directed toward ragweed pollen and tree pollen as listed below Continue allergen immunotherapy and have access to an epinephrine  autoinjector set per protocol Continue montelukast  10 mg once a day for allergy symptom control Continue RyVent  6 mg twice a day for runny nose or itch.  If I cannot becomes too expensive, let us  know and we can order carbinoxamine .  Placed RyVent . Continue Ryaltris  2 sprays in each nostril up to twice a day if needed for nasal symptoms Consider saline nasal rinses as needed for nasal symptoms. Use this before any medicated nasal sprays for best result  Allergic conjunctivitis Continue [employer] drops  Atopic dermatitis  If you experience a rash on your right wrist, try Eucrisa to red and itchy areas up to twice a day if needed Continue twice a day moisturizer For stubborn red and itchy areas below your face, continue clobetasol  up to twice a day if needed.  Do not use this medication longer than 2 weeks in a row  Lip swelling Refer to dermatology  Call the clinic if this treatment plan is not working well for you.  Follow up in 6 months or sooner if needed.  Reducing Pollen Exposure The American [employer] of All[employer] suggests the following steps to reduce your exposure to pollen during allergy seasons. Do not hang sheets or clothing out to dry; pollen may collect on these items. Do not mow lawns or spend time around freshly cut grass; mowing stirs up pollen. Keep windows closed at night.  Keep car windows closed while driving. Minimize morning activities outdoors, a time when pollen counts are usually at their highest. Stay indoors as much as  possible when pollen counts or humidity is high and on windy days when pollen tends to remain in the air longer. Use air conditioning when possible.  Many air conditioners have filters that trap the pollen spores. Use a HEPA room air filter to remove pollen form the indoor air you breathe.

## 2024-07-08 NOTE — Progress Notes (Signed)
 "  [address] [place] [address] Dept: [phone]  FOLLOW UP NOTE  Patient ID: Derek Love, male    DOB: 1971-04-02  Age: 54 y.o. MRN: [id number] Date of Office Visit: 07/08/2024  Assessment  Chief Complaint: Follow-up (Pt comes in for follow up and med refills. Pt states all has been well and medicine has been helping )  HPI Derek Love is a 54 year old male who presents to the clinic for follow-up visit.  He was last seen in this clinic on 05/27/2024 by Dr. Jeneal for evaluation of allergic rhinitis, allergic conjunctivitis, asthma, and atopic dermatitis.    At today's visit, he reports asthma has been well-controlled with no shortness of breath, cough, or wheeze with activity or rest.  He continues montelukast  10 mg once a day and has not used his albuterol  over the last year.    Allergic rhinitis is reported as moderately well-controlled with symptoms including nasal congestion, occasional sneezing, and occasional postnasal drainage.  He continues RyVent  6 mg twice a day and Ryaltris  1 to 2 sprays in each nostril twice a day.  With relief of symptoms.  He began allergen immunotherapy directed toward ragweed pollen and tree pollen on 01/17/2023.  He continues allergen immunotherapy with no large or local reactions.  He reports a significant decrease in his symptoms of allergic rhinitis while continuing on allergen immunotherapy.  Allergic conjunctivitis is reported as moderately well-controlled with occasional red itchy eyes occurring mainly in the spring.  He continues months craft eyedrops as needed with relief of symptoms.  Atopic dermatitis is reported as moderately well-controlled with 1 area on his left wrist with redness and itch occurring in a flare in remission pattern.  He reports this has been well-controlled over the last several months.  During flares, he continues clobetasol  as needed, however, has not needed this over the last several months.  He does report  that he continues to experience lip swelling for which he is using hydrocortisone daily.  He reports that he has previously been to dermatology at which time he reports that he was told this area on his lip may be precancerous.  He did not follow-up with dermatology at that point.  He reports that if he does not use hydrocortisone, his lip begins to swell and tingle.  He is interested in following up with a new dermatologist at this time.  His current medications are listed in the chart.  Drug Allergies:  Allergies[1]  Physical Exam: BP 122/70 (BP Location: Right Arm)   Pulse 89   Temp 98.2 F (36.8 C)   SpO2 98%    Physical Exam Vitals reviewed.  Constitutional:      Appearance: Normal appearance.  HENT:     Head: Normocephalic and atraumatic.     Right Ear: Tympanic membrane normal.     Left Ear: Tympanic membrane normal.     Nose:     Comments: Bilateral nares slightly erythematous with thin clear nasal drainage noted.  Pharynx normal.  Ears normal.  Eyes normal. Eyes:     Conjunctiva/sclera: Conjunctivae normal.  Cardiovascular:     Rate and Rhythm: Normal rate and regular rhythm.     Heart sounds: Normal heart sounds. No murmur heard. Pulmonary:     Effort: Pulmonary effort is normal.     Breath sounds: Normal breath sounds.     Comments: Lungs clear to auscultation Musculoskeletal:        General: Normal range of motion.  Cervical back: Normal range of motion and neck supple.  Skin:    General: Skin is warm and dry.     Comments: No rash noted on right wrist.  No rash noted on lower lip.  Neurological:     Mental Status: He is alert and oriented to person, place, and time.  Psychiatric:        Mood and Affect: Mood normal.        Behavior: Behavior normal.        Thought Content: Thought content normal.        Judgment: Judgment normal.     Assessment and Plan: 1. Mild intermittent asthma, uncomplicated   2. Seasonal allergic rhinitis due to pollen   3.  Allergic conjunctivitis of both eyes   4. Allergic dermatitis   5. Lip swelling     Meds ordered this encounter  Medications   montelukast  (SINGULAIR ) 10 MG tablet    Sig: Take 1 tablet (10 mg total) by mouth at bedtime.    Dispense:  30 tablet    Refill:  5   Olopatadine -Mometasone (RYALTRIS ) 665-25 MCG/ACT SUSP    Sig: Place 2 sprays into the nose 2 (two) times daily.    Dispense:  29 g    Refill:  0   Carbinoxamine  Maleate (RYVENT ) 6 MG TABS    Sig: Take 1 tablet (6 mg total) by mouth 2 (two) times daily.    Dispense:  60 tablet    Refill:  5    Patient Instructions  Asthma Continue montelukast  10 mg once a day to prevent cough or wheeze Continue albuterol  2 puffs once every 4 hours if needed for cough or wheeze You may use albuterol  2 puffs 5-15 minutes before activity to decrease cough or wheeze   Allergic rhinitis Continue allergen avoidance measures directed toward ragweed pollen and tree pollen as listed below Continue allergen immunotherapy and have access to an epinephrine  autoinjector set per protocol Continue montelukast  10 mg once a day for allergy symptom control Continue RyVent  6 mg twice a day for runny nose or itch.  If I cannot becomes too expensive, let us  know and we can order carbinoxamine .  Placed RyVent . Continue Ryaltris  2 sprays in each nostril up to twice a day if needed for nasal symptoms Consider saline nasal rinses as needed for nasal symptoms. Use this before any medicated nasal sprays for best result  Allergic conjunctivitis Continue L[employer]fter[employer] drops  Atopic dermatitis  If you experience a rash on your right wrist, try Eucrisa to red and itchy areas up to twice a day if needed Continue twice a day moisturizer For stubborn red and itchy areas below your face, continue clobetasol  up to twice a day if needed.  Do not use this medication longer than 2 weeks in a row  Lip swelling Refer to dermatology  Call the clinic if this treatment plan is  not working well for you.  Follow up in 6 months or sooner if needed.   Return in about 6 months (around 01/05/2025), or if symptoms worsen or fail to improve.    Thank you for the opportunity to care for this patient.  Please do not hesitate to contact me with questions.  Arlean Mutter, FNP [facility]          [1] No Known Allergies  "

## 2024-07-14 ENCOUNTER — Encounter: Admitting: Adult Health

## 2024-07-16 ENCOUNTER — Ambulatory Visit: Admitting: Adult Health

## 2024-07-16 ENCOUNTER — Encounter: Payer: Self-pay | Admitting: Adult Health

## 2024-07-16 ENCOUNTER — Ambulatory Visit: Payer: Self-pay | Admitting: Adult Health

## 2024-07-16 VITALS — BP 110/70 | HR 71 | Temp 98.4°F | Ht 71.0 in | Wt 232.0 lb

## 2024-07-16 DIAGNOSIS — Z0000 Encounter for general adult medical examination without abnormal findings: Secondary | ICD-10-CM

## 2024-07-16 DIAGNOSIS — K219 Gastro-esophageal reflux disease without esophagitis: Secondary | ICD-10-CM | POA: Diagnosis not present

## 2024-07-16 DIAGNOSIS — Z125 Encounter for screening for malignant neoplasm of prostate: Secondary | ICD-10-CM | POA: Diagnosis not present

## 2024-07-16 DIAGNOSIS — I2510 Atherosclerotic heart disease of native coronary artery without angina pectoris: Secondary | ICD-10-CM

## 2024-07-16 DIAGNOSIS — J302 Other seasonal allergic rhinitis: Secondary | ICD-10-CM

## 2024-07-16 DIAGNOSIS — E785 Hyperlipidemia, unspecified: Secondary | ICD-10-CM

## 2024-07-16 DIAGNOSIS — I10 Essential (primary) hypertension: Secondary | ICD-10-CM | POA: Diagnosis not present

## 2024-07-16 LAB — CBC
HCT: 45.2 % (ref 39.0–52.0)
Hemoglobin: 15.6 g/dL (ref 13.0–17.0)
MCHC: 34.6 g/dL (ref 30.0–36.0)
MCV: 87.5 fl (ref 78.0–100.0)
Platelets: 148 K/uL — ABNORMAL LOW (ref 150.0–400.0)
RBC: 5.16 Mil/uL (ref 4.22–5.81)
RDW: 13.2 % (ref 11.5–15.5)
WBC: 6.1 K/uL (ref 4.0–10.5)

## 2024-07-16 LAB — PSA: PSA: 0.21 ng/mL (ref 0.10–4.00)

## 2024-07-16 LAB — LIPID PANEL
Cholesterol: 138 mg/dL (ref 28–200)
HDL: 46.6 mg/dL
LDL Cholesterol: 70 mg/dL (ref 10–99)
NonHDL: 91.55
Total CHOL/HDL Ratio: 3
Triglycerides: 110 mg/dL (ref 10.0–149.0)
VLDL: 22 mg/dL (ref 0.0–40.0)

## 2024-07-16 LAB — COMPREHENSIVE METABOLIC PANEL WITH GFR
ALT: 34 U/L (ref 3–53)
AST: 23 U/L (ref 5–37)
Albumin: 4.5 g/dL (ref 3.5–5.2)
Alkaline Phosphatase: 74 U/L (ref 39–117)
BUN: 17 mg/dL (ref 6–23)
CO2: 32 meq/L (ref 19–32)
Calcium: 9.2 mg/dL (ref 8.4–10.5)
Chloride: 101 meq/L (ref 96–112)
Creatinine, Ser: 1.05 mg/dL (ref 0.40–1.50)
GFR: 81.96 mL/min
Glucose, Bld: 89 mg/dL (ref 70–99)
Potassium: 4.3 meq/L (ref 3.5–5.1)
Sodium: 140 meq/L (ref 135–145)
Total Bilirubin: 0.6 mg/dL (ref 0.2–1.2)
Total Protein: 6.6 g/dL (ref 6.0–8.3)

## 2024-07-16 MED ORDER — OLMESARTAN MEDOXOMIL 40 MG PO TABS: 40.0000 mg | ORAL_TABLET | Freq: Every day | ORAL | 3 refills | Status: AC

## 2024-07-16 MED ORDER — PANTOPRAZOLE SODIUM 40 MG PO TBEC: 40.0000 mg | DELAYED_RELEASE_TABLET | Freq: Every day | ORAL | 3 refills | Status: AC

## 2024-07-16 MED ORDER — ROSUVASTATIN CALCIUM 40 MG PO TABS: 40.0000 mg | ORAL_TABLET | Freq: Every day | ORAL | 3 refills | Status: AC

## 2024-07-16 NOTE — Progress Notes (Signed)
 "  Subjective:    Patient ID: Derek Love, male    DOB: 03-May-1971, 54 y.o.   MRN: [id number]  HPI Patient presents for yearly preventative medicine examination. He is a 54 year old male who  has a past medical history of ADHD (attention deficit hyperactivity disorder), Allergic rhinitis, Asthma, Coronary artery disease, Eczema, GERD (gastroesophageal reflux disease), Headache(784.0), Hypertension, and Positive PPD, treated.  HTN -managed with Benicar  40 mg daily.  He denies lightheadedness, chest pain, or shortness of breath. He has been experiencing fatigue and dizziness from time to time. He reports making extensive lifestyle changes since being placed on Benicar .  BP Readings from Last 3 Encounters:  07/16/24 110/70  07/08/24 122/70  04/23/24 122/75   Seasonal Allergies/Asthma - he has recently started allergy shots and has noticed improvement in his allergies.   GERD- managed with protonix  40 mg daily. He did have a recent endoscopy which showed chronic gastritis. He continues to feel  raw. He has an upcoming appointment with GI.   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 The lesion is small and deemed manageable with medication. He is managed with Lipitor 40 mg daily and ASA 81 mg daily.  Lab Results  Component Value Date   CHOL 124 08/07/2023   HDL 52 08/07/2023   LDLCALC 55 08/07/2023   LDLDIRECT 148.0 05/06/2022   TRIG 87 08/07/2023   CHOLHDL 2.4 08/07/2023    All immunizations and health maintenance protocols were reviewed with the patient and needed orders were placed. He is going to hold off on shingles and pneumonia vaccination.   Appropriate screening laboratory values were ordered for the patient including screening of hyperlipidemia, renal function and hepatic function. If indicated by BPH, a PSA was ordered.  Medication reconciliation,  past  medical history, social history, problem list and allergies were reviewed in detail with the patient  Goals were established with regard to weight loss, exercise, and  diet in compliance with medications. He has not been doing any formal exercise in the last few months but plans to get back into crossfit.  Wt Readings from Last 10 Encounters:  07/16/24 232 lb (105.2 kg)  04/23/24 225 lb (102.1 kg)  04/09/24 225 lb (102.1 kg)  11/27/23 221 lb (100.2 kg)  10/06/23 227 lb (103 kg)  09/24/23 225 lb (102.1 kg)  09/22/23 227 lb (103 kg)  09/11/23 221 lb (100.2 kg)  08/06/23 218 lb (98.9 kg)  05/21/23 221 lb (100.2 kg)   He is up to date on routine colon cancer screening.   Review of Systems  Constitutional: Negative.   HENT: Negative.    Eyes: Negative.   Respiratory: Negative.    Cardiovascular: Negative.   Gastrointestinal:  Positive for abdominal pain.  Endocrine: Negative.   Genitourinary: Negative.   Musculoskeletal: Negative.   Skin: Negative.   Allergic/Immunologic: Negative.   Neurological: Negative.   Hematological: Negative.   Psychiatric/Behavioral: Negative.    All other systems reviewed and are negative.  Past Medical History:  Diagnosis Date   ADHD (attention deficit hyperactivity disorder)    Allergic rhinitis    Asthma    Coronary artery disease    Eczema    GERD (gastroesophageal reflux disease)    Headache(784.0)    Hypertension    Positive PPD, treated     Social History   Socioeconomic History   Marital status: Married  Spouse name: Not on file   Number of children: 2   Years of education: Not on file   Highest education level: Not on file  Occupational History   Occupation: Aeronautical Engineer  Tobacco Use   Smoking status: Never   Smokeless tobacco: Never  Vaping Use   Vaping status: Some Days   Substances: THC   Devices: occasionally 1x a week  Substance and Sexual Activity   Alcohol use: Yes    Alcohol/week: 6.0 standard drinks of  alcohol    Types: 6 Standard drinks or equivalent per week    Comment: occ   Drug use: No   Sexual activity: Yes  Other Topics Concern   Not on file  Social History Narrative   Not on file   Social Drivers of Health   Tobacco Use: Low Risk (07/16/2024)   Patient History    Smoking Tobacco Use: Never    Smokeless Tobacco Use: Never    Passive Exposure: Not on file  Financial Resource Strain: Not on file  Food Insecurity: Not on file  Transportation Needs: Not on file  Physical Activity: Not on file  Stress: Not on file  Social Connections: Unknown (11/13/2021)   [facility]   Social Network    Social Network: Not on file  Intimate Partner Violence: Unknown (10/05/2021)   Received from [facility]   HITS    Physically Hurt: Not on file    Insult or Talk Down To: Not on file    Threaten Physical Harm: Not on file    Scream or Curse: Not on file  Depression (PHQ2-9): Low Risk (11/27/2023)   Depression (PHQ2-9)    PHQ-2 Score: 0  Alcohol Screen: Not on file  Housing: Not on file  Utilities: Not on file  Health Literacy: Not on file    Past Surgical History:  Procedure Laterality Date   LEFT HEART CATH AND CORONARY ANGIOGRAPHY N/A 09/24/2023   Procedure: LEFT HEART CATH AND CORONARY ANGIOGRAPHY;  Surgeon: Jordan, Peter M, MD;  Location: [facility];  Service: Cardiovascular;  Laterality: N/A;   UPPER GI ENDOSCOPY  2001    Family History  Problem Relation Age of Onset   Colon polyps Mother 2   Heart disease Father    Stroke Father    Colon polyps Father 100   Pancreatic cancer Father 37   Hypertrophic cardiomyopathy Sister    Allergic rhinitis Maternal Grandmother    Heart disease Paternal Grandfather    Coronary artery disease Other    Diabetes Other    Hyperlipidemia Other    Hypertension Other    Lung cancer Other    Stroke Other    Liver cancer Neg Hx    Colon cancer Neg Hx    Esophageal cancer Neg Hx    Rectal cancer Neg Hx     Stomach cancer Neg Hx     Allergies[1]  Medications Ordered Prior to Encounter[2]  BP 110/70   Pulse 71   Temp 98.4 F (36.9 C) (Oral)   Ht 5' 11 (1.803 m)   Wt 232 lb (105.2 kg)   SpO2 96%   BMI 32.36 kg/m       Objective:   Physical Exam Vitals and nursing note reviewed.  Constitutional:      General: He is not in acute distress.    Appearance: Normal appearance. He is not ill-appearing.  HENT:     Head: Normocephalic and atraumatic.     Right  Ear: Tympanic membrane, ear canal and external ear normal. There is no impacted cerumen.     Left Ear: Tympanic membrane, ear canal and external ear normal. There is no impacted cerumen.     Nose: Nose normal. No congestion or rhinorrhea.     Mouth/Throat:     Mouth: Mucous membranes are moist.     Pharynx: Oropharynx is clear.  Eyes:     Extraocular Movements: Extraocular movements intact.     Conjunctiva/sclera: Conjunctivae normal.     Pupils: Pupils are equal, round, and reactive to light.  Neck:     Vascular: No carotid bruit.  Cardiovascular:     Rate and Rhythm: Normal rate and regular rhythm.     Pulses: Normal pulses.     Heart sounds: No murmur heard.    No friction rub. No gallop.  Pulmonary:     Effort: Pulmonary effort is normal.     Breath sounds: Normal breath sounds.  Abdominal:     General: Abdomen is flat. Bowel sounds are normal. There is no distension.     Palpations: Abdomen is soft. There is no mass.     Tenderness: There is no abdominal tenderness. There is no guarding or rebound.     Hernia: No hernia is present.  Musculoskeletal:        General: Normal range of motion.     Cervical back: Normal range of motion and neck supple.  Lymphadenopathy:     Cervical: No cervical adenopathy.  Skin:    General: Skin is warm and dry.     Capillary Refill: Capillary refill takes less than 2 seconds.  Neurological:     General: No focal deficit present.     Mental Status: He is alert and oriented to  person, place, and time.  Psychiatric:        Mood and Affect: Mood normal.        Behavior: Behavior normal.        Thought Content: Thought content normal.        Judgment: Judgment normal.        Assessment & Plan:   1. Routine general medical examination at a health care facility (Primary) Today patient counseled on age appropriate routine health concerns for screening and prevention, each reviewed and up to date or declined. Immunizations reviewed and up to date or declined. Labs ordered and reviewed. Risk factors for depression reviewed and negative. Hearing function and visual acuity are intact. ADLs screened and addressed as needed. Functional ability and level of safety reviewed and appropriate. Education, counseling and referrals performed based on assessed risks today. Patient provided with a copy of personalized plan for preventive services.   2. Essential hypertension - well controlled. No change in medication  - Lipid panel; Future - CBC; Future - Comprehensive metabolic panel with GFR; Future - olmesartan  (BENICAR ) 40 MG tablet; Take 1 tablet (40 mg total) by mouth daily. Due for annual exam  Dispense: 90 tablet; Refill: 3 - Comprehensive metabolic panel with GFR - CBC - Lipid panel  3. Seasonal allergic rhinitis, unspecified trigger - per allergy and asthma   4. Gastroesophageal reflux disease, unspecified whether esophagitis present - Continue Protonix . Follow up with GI as directed - pantoprazole  (PROTONIX ) 40 MG tablet; Take 1 tablet (40 mg total) by mouth daily.  Dispense: 90 tablet; Refill: 3  5. Coronary artery disease involving native coronary artery of native heart without angina pectoris - Continue with statin and ASA - Lipid panel;  Future - CBC; Future - Comprehensive metabolic panel with GFR; Future - rosuvastatin  (CRESTOR ) 40 MG tablet; Take 1 tablet (40 mg total) by mouth daily.  Dispense: 90 tablet; Refill: 3 - Comprehensive metabolic panel with  GFR - CBC - Lipid panel  6. Hyperlipidemia, unspecified hyperlipidemia type - Continue with statin  - Lipid panel; Future - CBC; Future - Comprehensive metabolic panel with GFR; Future - rosuvastatin  (CRESTOR ) 40 MG tablet; Take 1 tablet (40 mg total) by mouth daily.  Dispense: 90 tablet; Refill: 3 - Comprehensive metabolic panel with GFR - CBC - Lipid panel  7. Prostate cancer screening  - PSA; Future - PSA   Darleene Shape, NP     [1] No Known Allergies [2]  Current Outpatient Medications on File Prior to Visit  Medication Sig Dispense Refill   aspirin  EC 81 MG tablet Take 81 mg by mouth daily.     Carbinoxamine  Maleate (RYVENT ) 6 MG TABS Take 1 tablet (6 mg total) by mouth 2 (two) times daily. 60 tablet 5   clobetasol  ointment (TEMOVATE ) 0.05 % Apply 1 Application topically 2 (two) times daily as needed (itchy rash). 60 g 5   montelukast  (SINGULAIR ) 10 MG tablet Take 1 tablet (10 mg total) by mouth at bedtime. 30 tablet 5   Multiple Vitamin (MULTIVITAMIN) tablet Take 1 tablet by mouth daily.     olmesartan  (BENICAR ) 40 MG tablet Take 1 tablet (40 mg total) by mouth daily. Due for annual exam 90 tablet 0   Olopatadine -Mometasone (RYALTRIS ) 665-25 MCG/ACT SUSP Place 2 sprays into the nose 2 (two) times daily. 29 g 0   OVER THE COUNTER MEDICATION Place 1 drop into both eyes 2 (two) times daily. [place]  (Patient taking differently: Place 1 drop into both eyes as needed. [place] )     pantoprazole  (PROTONIX ) 40 MG tablet TAKE 1 TABLET BY MOUTH DAILY 30 tablet 3   rosuvastatin  (CRESTOR ) 40 MG tablet TAKE ONE TABLET BY MOUTH ONCE A DAY 90 tablet 0   No current facility-administered medications on file prior to visit.   "

## 2024-07-20 ENCOUNTER — Telehealth: Payer: Self-pay | Admitting: Family Medicine

## 2024-07-20 NOTE — Telephone Encounter (Signed)
 Derek Love is scheduled with Dr. Orman for 9/30 at 8:00 am

## 2024-08-04 ENCOUNTER — Ambulatory Visit

## 2024-08-04 DIAGNOSIS — J302 Other seasonal allergic rhinitis: Secondary | ICD-10-CM | POA: Diagnosis not present

## 2024-08-18 ENCOUNTER — Ambulatory Visit: Admitting: Gastroenterology

## 2025-03-30 ENCOUNTER — Ambulatory Visit: Admitting: Physician Assistant
